# Patient Record
Sex: Male | Born: 1985 | Race: Black or African American | Hispanic: No | Marital: Single | Smoking: Former smoker
Health system: Southern US, Community
[De-identification: ages and names within clinical notes are randomized; demographics above are authoritative.]

---
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
## 2008-04-20 ENCOUNTER — Emergency Department (HOSPITAL_COMMUNITY): Admission: EM | Admit: 2008-04-20 | Discharge: 2008-04-20 | Payer: Self-pay | Admitting: Emergency Medicine

## 2011-11-09 ENCOUNTER — Encounter (HOSPITAL_COMMUNITY): Payer: Self-pay | Admitting: *Deleted

## 2011-11-09 ENCOUNTER — Emergency Department (HOSPITAL_COMMUNITY)
Admission: EM | Admit: 2011-11-09 | Discharge: 2011-11-09 | Disposition: A | Discharge status: Home / Self Care | Payer: Self-pay | Attending: Emergency Medicine | Admitting: Emergency Medicine

## 2011-11-09 DIAGNOSIS — S39012A Strain of muscle, fascia and tendon of lower back, initial encounter: Secondary | ICD-10-CM

## 2011-11-09 DIAGNOSIS — X500XXA Overexertion from strenuous movement or load, initial encounter: Secondary | ICD-10-CM | POA: Insufficient documentation

## 2011-11-09 DIAGNOSIS — S335XXA Sprain of ligaments of lumbar spine, initial encounter: Secondary | ICD-10-CM | POA: Insufficient documentation

## 2011-11-09 DIAGNOSIS — F17200 Nicotine dependence, unspecified, uncomplicated: Secondary | ICD-10-CM | POA: Insufficient documentation

## 2011-11-09 MED ORDER — IBUPROFEN 600 MG PO TABS: 600.0000 mg | ORAL_TABLET | Freq: Four times a day (QID) | ORAL | Status: AC | PRN

## 2011-11-09 MED ORDER — DIAZEPAM 5 MG PO TABS
5.0000 mg | ORAL_TABLET | Freq: Once | ORAL | Status: AC
  Administered 2011-11-09: 5 mg via ORAL
  Filled 2011-11-09: qty 1

## 2011-11-09 MED ORDER — OXYCODONE-ACETAMINOPHEN 5-325 MG PO TABS: 1.0000 | ORAL_TABLET | Freq: Four times a day (QID) | ORAL | Status: AC | PRN

## 2011-11-09 MED ORDER — IBUPROFEN 400 MG PO TABS
600.0000 mg | ORAL_TABLET | Freq: Once | ORAL | Status: AC
  Administered 2011-11-09: 600 mg via ORAL
  Filled 2011-11-09: qty 3

## 2011-11-09 MED ORDER — DIAZEPAM 5 MG PO TABS: 5.0000 mg | ORAL_TABLET | Freq: Four times a day (QID) | ORAL | Status: AC | PRN

## 2011-11-09 MED ORDER — OXYCODONE-ACETAMINOPHEN 5-325 MG PO TABS
1.0000 | ORAL_TABLET | Freq: Once | ORAL | Status: AC
  Administered 2011-11-09: 1 via ORAL
  Filled 2011-11-09: qty 1

## 2011-11-09 NOTE — ED Provider Notes (Signed)
History     CSN: [id number]  Arrival date & time 11/09/11  4098   First MD Initiated Contact with Patient 11/09/11 [phone]      Chief Complaint  Patient presents with  . Back Pain    (Consider location/radiation/quality/duration/timing/severity/associated sxs/prior treatment) Patient is a 26 y.o. male presenting with back pain. The history is provided by the patient.  Back Pain  This is a recurrent problem. Pertinent negatives include no chest pain, no numbness, no abdominal pain and no weakness.  patient has lower back pain after lifting boxes and tattooing while bent over. He states that he felt a pop and then had pain. No numbness or weakness. Pain is worse with movement or sitting down. No loss of bladder or bowel control. 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 No abdominal pain. No dysuria.  History reviewed. No pertinent past medical history.  History reviewed. No pertinent past surgical history.  No family history on file.  History  Substance Use Topics  . Smoking status: Current Everyday Smoker  . Smokeless tobacco: Not on file  . Alcohol Use:      occ      Review of Systems  Constitutional: Negative for chills.  Cardiovascular: Negative for chest pain.  Gastrointestinal: Negative for abdominal pain.  Genitourinary: Negative for flank pain and penile pain.  Musculoskeletal: Positive for back pain.  Neurological: Negative for weakness and numbness.  Hematological: Negative for adenopathy. Does not bruise/bleed easily.    Allergies  Review of patient's allergies indicates no known allergies.  Home Medications   Current Outpatient Rx  Name Route Sig Dispense Refill  . CYCLOBENZAPRINE HCL 10 MG PO TABS Oral Take 10 mg by mouth once.    Marland Kitchen DIAZEPAM 5 MG PO TABS Oral Take 1 tablet (5 mg total) by mouth every 6 (six) hours as needed (spasm). 10 tablet 0  . IBUPROFEN 600 MG PO TABS Oral Take 1 tablet (600 mg total) by mouth every 6 (six)  hours as needed for pain. 20 tablet 0  . OXYCODONE-ACETAMINOPHEN 5-325 MG PO TABS Oral Take 1-2 tablets by mouth every 6 (six) hours as needed for pain. 10 tablet 0    BP 110/52  Pulse 78  Temp 97.9 F (36.6 C) (Oral)  Resp 18  SpO2 100%  Physical Exam  Constitutional: He appears well-developed and well-nourished.  HENT:  Head: Normocephalic.  Eyes: Pupils are equal, round, and reactive to light.  Neck: Neck supple.  Cardiovascular: Normal rate and regular rhythm.   Abdominal: There is no tenderness.  Musculoskeletal:       Decreased range of motion in lower back. Pain with movement. No midline step-off or deformity.  Neurological: He is alert.       Lower extremity and peroneal sensation strength intact.  Skin: Skin is warm.    ED Course  Procedures (including critical care time)  Labs Reviewed - No data to display No results found.   1. Lumbar strain       MDM  Patient with lumbar strain. No red flags. Patient's had a history of back pain previously. He will be discharged home with pain meds and muscle relaxers        Juliet Rude. Rubin Payor, MD 11/09/11 [phone]

## 2011-11-09 NOTE — ED Notes (Signed)
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  Pt also works at [employer].  Pt states hurts to sit down.    Pain only with movement

## 2012-05-04 ENCOUNTER — Emergency Department (HOSPITAL_COMMUNITY)
Admission: EM | Admit: 2012-05-04 | Discharge: 2012-05-04 | Disposition: A | Discharge status: Home / Self Care | Payer: BC Managed Care – PPO | Source: Home / Self Care | Attending: Family Medicine | Admitting: Family Medicine

## 2012-05-04 ENCOUNTER — Encounter (HOSPITAL_COMMUNITY): Payer: Self-pay | Admitting: Emergency Medicine

## 2012-05-04 DIAGNOSIS — M25519 Pain in unspecified shoulder: Secondary | ICD-10-CM

## 2012-05-04 MED ORDER — TRAMADOL HCL 50 MG PO TABS: 50.0000 mg | ORAL_TABLET | Freq: Three times a day (TID) | ORAL | Status: DC | PRN

## 2012-05-04 MED ORDER — CYCLOBENZAPRINE HCL 10 MG PO TABS: 10.0000 mg | ORAL_TABLET | Freq: Two times a day (BID) | ORAL | Status: DC | PRN

## 2012-05-04 MED ORDER — IBUPROFEN 600 MG PO TABS: 600.0000 mg | ORAL_TABLET | Freq: Three times a day (TID) | ORAL | Status: DC | PRN

## 2012-05-04 NOTE — ED Provider Notes (Signed)
History     CSN: [id number]  Arrival date & time 05/04/12  1518   First MD Initiated Contact with Patient 05/04/12 1538      Chief Complaint  Patient presents with  . Shoulder Pain    left shoulder pain x 1 wk.     (Consider location/radiation/quality/duration/timing/severity/associated sxs/prior treatment) HPI Comments: 27 year old smoker male with no significant past medical history. Here complaining of left shoulder pain and limited range of motion for about 3 days. Patient works at [employer] and [employer] all day. He also states that has young children he picks up from floor recently. Patient does use ice and stretching with mild relief. Denies pain radiation. Denies weakness, numbness or paresthesia in the left arm or hand. No back or neck pain.   History reviewed. No pertinent past medical history.  History reviewed. No pertinent past surgical history.  History reviewed. No pertinent family history.  History  Substance Use Topics  . Smoking status: Current Every Day Smoker  . Smokeless tobacco: Not on file  . Alcohol Use: Not on file     Comment: occ      Review of Systems  Constitutional: Negative for fever and chills.  Musculoskeletal:       Left shoulder as per HPI  Skin: Negative for rash and wound.    Allergies  Review of patient's allergies indicates no known allergies.  Home Medications   Current Outpatient Rx  Name  Route  Sig  Dispense  Refill  . cyclobenzaprine (FLEXERIL) 10 MG tablet   Oral   Take 1 tablet (10 mg total) by mouth 2 (two) times daily as needed for muscle spasms.   20 tablet   0   . ibuprofen (ADVIL,MOTRIN) 600 MG tablet   Oral   Take 1 tablet (600 mg total) by mouth every 8 (eight) hours as needed for pain.   21 tablet   0   . traMADol (ULTRAM) 50 MG tablet   Oral   Take 1 tablet (50 mg total) by mouth every 8 (eight) hours as needed for pain.   20 tablet   0     BP 115/69  Pulse 62  Temp(Src) 98.3 F (36.8 C) (Oral)   Resp 16  SpO2 100%  Physical Exam  Nursing note and vitals reviewed. Constitutional: He is oriented to person, place, and time. He appears well-developed and well-nourished. No distress.  HENT:  Head: Normocephalic and atraumatic.  Mouth/Throat: Oropharynx is clear and moist. No oropharyngeal exudate.  Cardiovascular: Normal rate, regular rhythm and normal heart sounds.   Pulmonary/Chest: Effort normal and breath sounds normal. No respiratory distress. He has no wheezes. He has no rales. He exhibits no tenderness.  Musculoskeletal:  Left shoulder: No obvious deformity, no erythema, swelling, bruising or increased temperature. Diffused tenderness anterior and superior to glenohumeral joint. Limited range of motion due to pain but no signs of dislocation. Patient able to passively and actively abduct left arm at shoulder joint past 90 degrees with reported moderate to severe pain. Positive empty can test. No significant discomfort with arm adduction past mid line or with posterior extension with extended arm.  Entire left upper extremity appears neurovascularly intact.  Lymphadenopathy:    He has no cervical adenopathy.  Neurological: He is alert and oriented to person, place, and time.  Skin: No rash noted. He is not diaphoretic.    ED Course  Procedures (including critical care time)  Labs Reviewed - No data to display No results  found.   1. Shoulder pain, acute, left       MDM  Impress bursitis/capsulitis versus rotator cuff tendinitis. Prescribed Flexeril, ibuprofen and tramadol. Supportive care including rehabilitation exercises and red flags that should prompt his return to medical attention discussed with patient and provided in writing.        Sharin Grave, MD 05/04/12 [phone]

## 2012-05-04 NOTE — ED Notes (Signed)
Pt states that he has limited ROM with left shoulder and is unable to lift arm above head.  Pt states there is a tightness/swelling feeing in the joint.  Pt works for [employer] and does a lot of lifting of boxes.  Pt has used ice and stretching with mild relief.

## 2012-06-14 ENCOUNTER — Emergency Department (INDEPENDENT_AMBULATORY_CARE_PROVIDER_SITE_OTHER): Payer: BC Managed Care – PPO

## 2012-06-14 ENCOUNTER — Emergency Department (HOSPITAL_COMMUNITY)
Admission: EM | Admit: 2012-06-14 | Discharge: 2012-06-14 | Disposition: A | Discharge status: Home / Self Care | Payer: BC Managed Care – PPO | Source: Home / Self Care | Attending: Emergency Medicine | Admitting: Emergency Medicine

## 2012-06-14 ENCOUNTER — Encounter (HOSPITAL_COMMUNITY): Payer: Self-pay | Admitting: Emergency Medicine

## 2012-06-14 DIAGNOSIS — M67912 Unspecified disorder of synovium and tendon, left shoulder: Secondary | ICD-10-CM

## 2012-06-14 DIAGNOSIS — M67919 Unspecified disorder of synovium and tendon, unspecified shoulder: Secondary | ICD-10-CM

## 2012-06-14 MED ORDER — DICLOFENAC SODIUM 75 MG PO TBEC: 75.0000 mg | DELAYED_RELEASE_TABLET | Freq: Two times a day (BID) | ORAL | Status: DC

## 2012-06-14 NOTE — ED Notes (Signed)
Pt c/o shoulder injury x 1.5 month. This is a recurrent pain that has not gotten better. Yesterday while working pt reports he felt a pop and now ROM is limited. Has tried stretching and warm treatment previously with relief. Patient is alert and oriented.

## 2012-06-14 NOTE — ED Provider Notes (Signed)
History     CSN: [id number]  Arrival date & time 06/14/12  1439   First MD Initiated Contact with Patient 06/14/12 1516      Chief Complaint  Patient presents with  . Shoulder Injury    (Consider location/radiation/quality/duration/timing/severity/associated sxs/prior treatment) HPI Comments:  Patient presents urgent care describing ongoing shoulder pain for the last 4-5 weeks. Patient describes that while he was at work yesterday and felt a pop top of her shoulder. Have been having pain since then has tried to stretch the shoulder and have been applying warm compresses with some minimal relief. Patient denies any weakness or paresthesias to his left upper extremity. Patient describes a sensation of grinding on top of his left shoulder points to acromioclavicular region.  Patient is a 27 y.o. male presenting with shoulder injury. The history is provided by the patient.  Shoulder Injury This is a recurrent problem. The problem occurs constantly. The problem has not changed since onset.Pertinent negatives include no abdominal pain and no shortness of breath. The symptoms are aggravated by bending (raising arm foward.Marland Kitchen). The symptoms are relieved by rest. The treatment provided no relief.    History reviewed. No pertinent past medical history.  History reviewed. No pertinent past surgical history.  History reviewed. No pertinent family history.  History  Substance Use Topics  . Smoking status: Current Every Day Smoker  . Smokeless tobacco: Not on file  . Alcohol Use: Not on file     Comment: occ      Review of Systems  Constitutional: Negative for fever, chills, diaphoresis, activity change, appetite change and fatigue.  Respiratory: Negative for cough and shortness of breath.   Gastrointestinal: Negative for abdominal pain.  Musculoskeletal: Positive for joint swelling. Negative for myalgias.  Skin: Negative for color change, pallor, rash and wound.  Neurological: Negative  for facial asymmetry, weakness and numbness.    Allergies  Review of patient's allergies indicates no known allergies.  Home Medications   Current Outpatient Rx  Name  Route  Sig  Dispense  Refill  . diclofenac (VOLTAREN) 75 MG EC tablet   Oral   Take 1 tablet (75 mg total) by mouth 2 (two) times daily.   14 tablet   0   . traMADol (ULTRAM) 50 MG tablet   Oral   Take 1 tablet (50 mg total) by mouth every 8 (eight) hours as needed for pain.   20 tablet   0     BP 114/73  Pulse 72  Temp(Src) 98.4 F (36.9 C) (Oral)  Resp 16  SpO2 99%  Physical Exam  Constitutional: Vital signs are normal. He appears well-developed and well-nourished.  Non-toxic appearance. He does not have a sickly appearance. He does not appear ill. No distress.  Musculoskeletal: He exhibits tenderness.       Left shoulder: He exhibits decreased range of motion, tenderness, bony tenderness, swelling and deformity. He exhibits no effusion, no crepitus, no laceration, no pain, no spasm, normal pulse and normal strength.       Arms: Neurological: He is alert.  Skin: No rash noted. No erythema.    ED Course  Procedures (including critical care time)  Labs Reviewed - No data to display Dg Shoulder Left  06/14/2012  *RADIOLOGY REPORT*  Clinical Data: Left shoulder pain for 2 months, no injury  LEFT SHOULDER - 2+ VIEW  Comparison: None.  Findings: The left humeral head is in normal position within the left glenohumeral joint space.  The left [facility]  joint is normally aligned.  No acute abnormality is seen.  IMPRESSION: Negative.   Original Report Authenticated By: Dwyane Dee, M.D.      1. Rotator cuff disorder, left       MDM  Left chronic and recurrent shoulder pain. Exam was somewhat consistent with a rotator cuff syndrome/injury. Patient had been placed on a shoulder sling and instructed to followup with the orthopedic Dr. been provided with a diclofenac prescription and also instructed not to take any  other NSAIDs. Patient agrees with treatment plan and followup care with orthopedic provider. Work no restrictions was provided for the next 3-5 days until seen by the orthopedic Dr.        Jimmie Molly, MD 06/14/12 1700

## 2012-06-29 MED ORDER — DICLOFENAC SODIUM 75 MG PO TBEC: 75.0000 mg | DELAYED_RELEASE_TABLET | Freq: Two times a day (BID) | ORAL | Status: DC

## 2012-11-18 ENCOUNTER — Emergency Department (INDEPENDENT_AMBULATORY_CARE_PROVIDER_SITE_OTHER): Payer: BC Managed Care – PPO

## 2012-11-18 ENCOUNTER — Encounter (HOSPITAL_COMMUNITY): Payer: Self-pay | Admitting: Emergency Medicine

## 2012-11-18 ENCOUNTER — Emergency Department (INDEPENDENT_AMBULATORY_CARE_PROVIDER_SITE_OTHER)
Admission: EM | Admit: 2012-11-18 | Discharge: 2012-11-18 | Disposition: A | Discharge status: Home / Self Care | Payer: BC Managed Care – PPO | Source: Home / Self Care | Attending: Family Medicine | Admitting: Family Medicine

## 2012-11-18 DIAGNOSIS — S63259A Unspecified dislocation of unspecified finger, initial encounter: Secondary | ICD-10-CM

## 2012-11-18 NOTE — ED Provider Notes (Signed)
Scott Black is a 26 y.o. male who presents to Urgent Care today for left hand pain. Patient dislocated his left PIP playing basketball yesterday. He noted the left finger ulnarly deviated at the PIP joint. He reduced it himself. He notes pain and swelling at the PIP joint currently. He denies any significant difficulty with motion. He denies any radiating pain weakness or numbness. Feels well otherwise.   History reviewed. No pertinent past medical history. History  Substance Use Topics  . Smoking status: Current Every Day Smoker  . Smokeless tobacco: Not on file  . Alcohol Use: Yes     Comment: occ   ROS as above Medications reviewed. No current facility-administered medications for this encounter.   Current Outpatient Prescriptions  Medication Sig Dispense Refill  . diclofenac (VOLTAREN) 75 MG EC tablet Take 1 tablet (75 mg total) by mouth 2 (two) times daily.  14 tablet  0  . diclofenac (VOLTAREN) 75 MG EC tablet Take 1 tablet (75 mg total) by mouth 2 (two) times daily.  14 tablet  0  . traMADol (ULTRAM) 50 MG tablet Take 1 tablet (50 mg total) by mouth every 8 (eight) hours as needed for pain.  20 tablet  0    Exam:  BP 127/73  Pulse 85  Temp(Src) 98.5 F (36.9 C) (Oral)  Resp 20  SpO2 100% Gen: Well NAD Left fifth digit: Swelling ecchymosis at the PIP. Flexion and extension are intact. Sensation is intact. No swan-neck or boutonniere deformity present.     No results found for this or any previous visit (from the past 24 hour(s)). Dg Finger Little Left  11/18/2012   CLINICAL DATA:  PIP dislocation. Self reduction.  EXAM: LEFT LITTLE FINGER 2+V  COMPARISON:  None.  FINDINGS: No subluxation or dislocation currently. On the lateral view, there is a tiny bone density overlying the distal aspect of the proximal phalanx which may very small avulsed fragment. No additional acute bony abnormality. Soft tissues are intact. Soft tissues are intact.  IMPRESSION: Suspect small avulsed  fragment anteriorly. No subluxation or dislocation.   Electronically Signed   By: Charlett Nose M.D.   On: 11/18/2012 12:46    Assessment and Plan: 27 y.o. male with possible tiny avulsion fragment following a quarter deviation dislocation of the PIP of the left fifth digit.  Plan for buddy taping 2 fingers together for at least one week.And one month at basketball Aleve for pain as needed.  Come back as needed.      Rodolph Bong, MD 11/18/12 1308

## 2012-11-18 NOTE — ED Notes (Signed)
Left hand injury while playing basketball yesterday.  Reports initially left little finger was pointing to the side and patient "straightened " it.  Finger bruised, swollen and painful, unable to move as normally moves this finger.  Brisk cap refill to this finger.

## 2013-05-30 IMAGING — CR DG SHOULDER 2+V*L*
3 series · 3 of 3 positions shown · non-contrast
Comparison: None.

CLINICAL DATA: Left shoulder pain for 2 months, no injury

LEFT SHOULDER - 2+ VIEW

[view not recorded (1 of 3)]
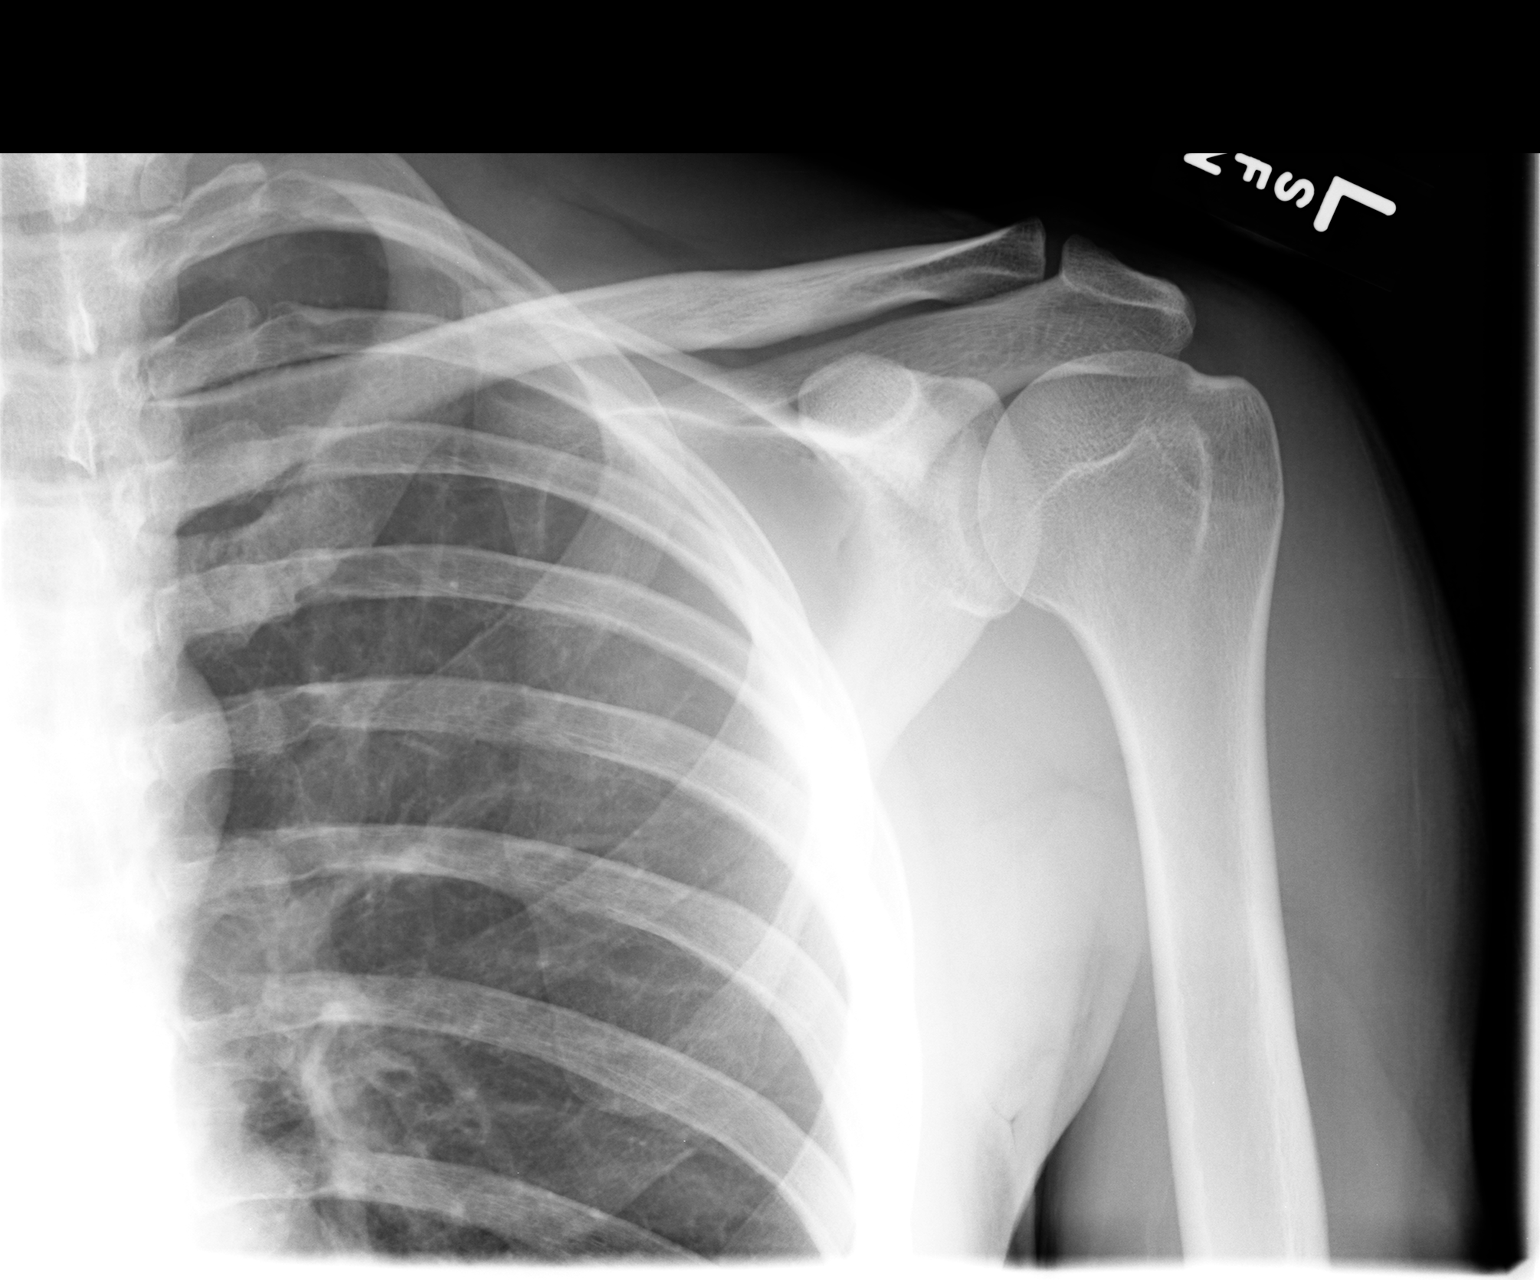

[view not recorded (2 of 3)]
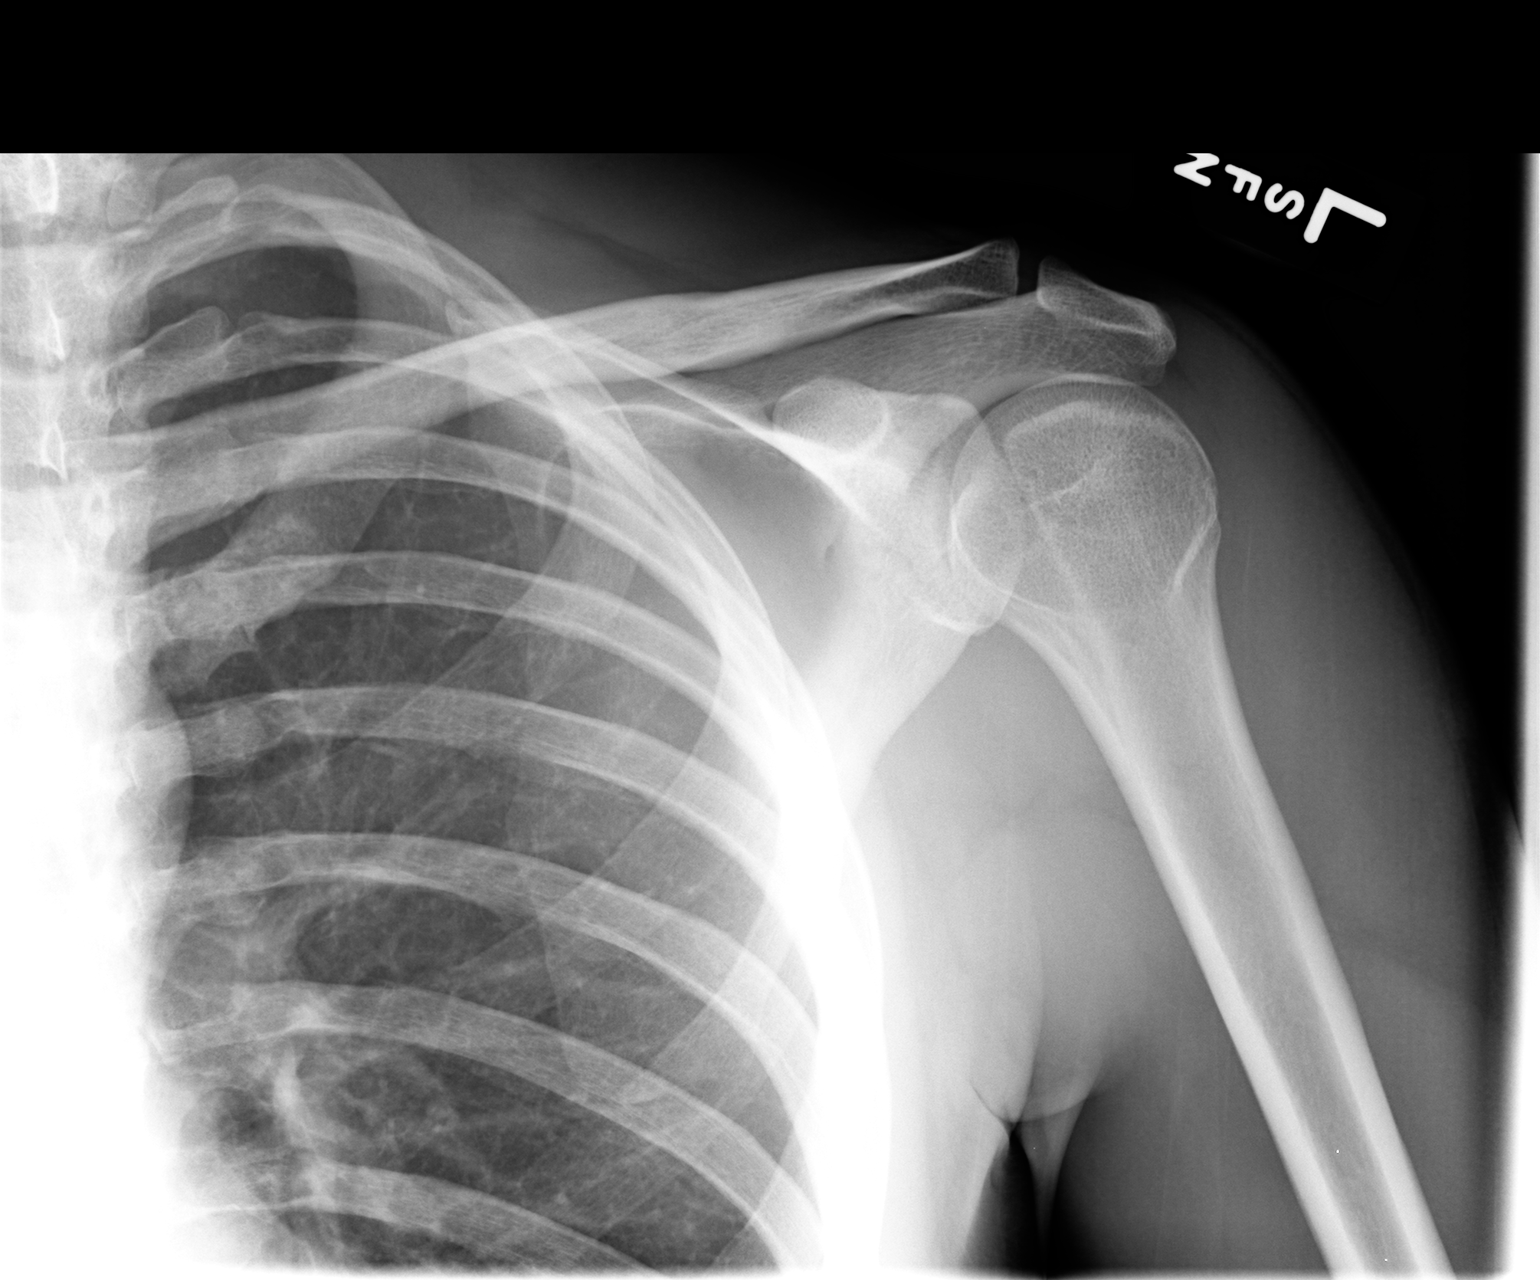

[view not recorded (3 of 3)]
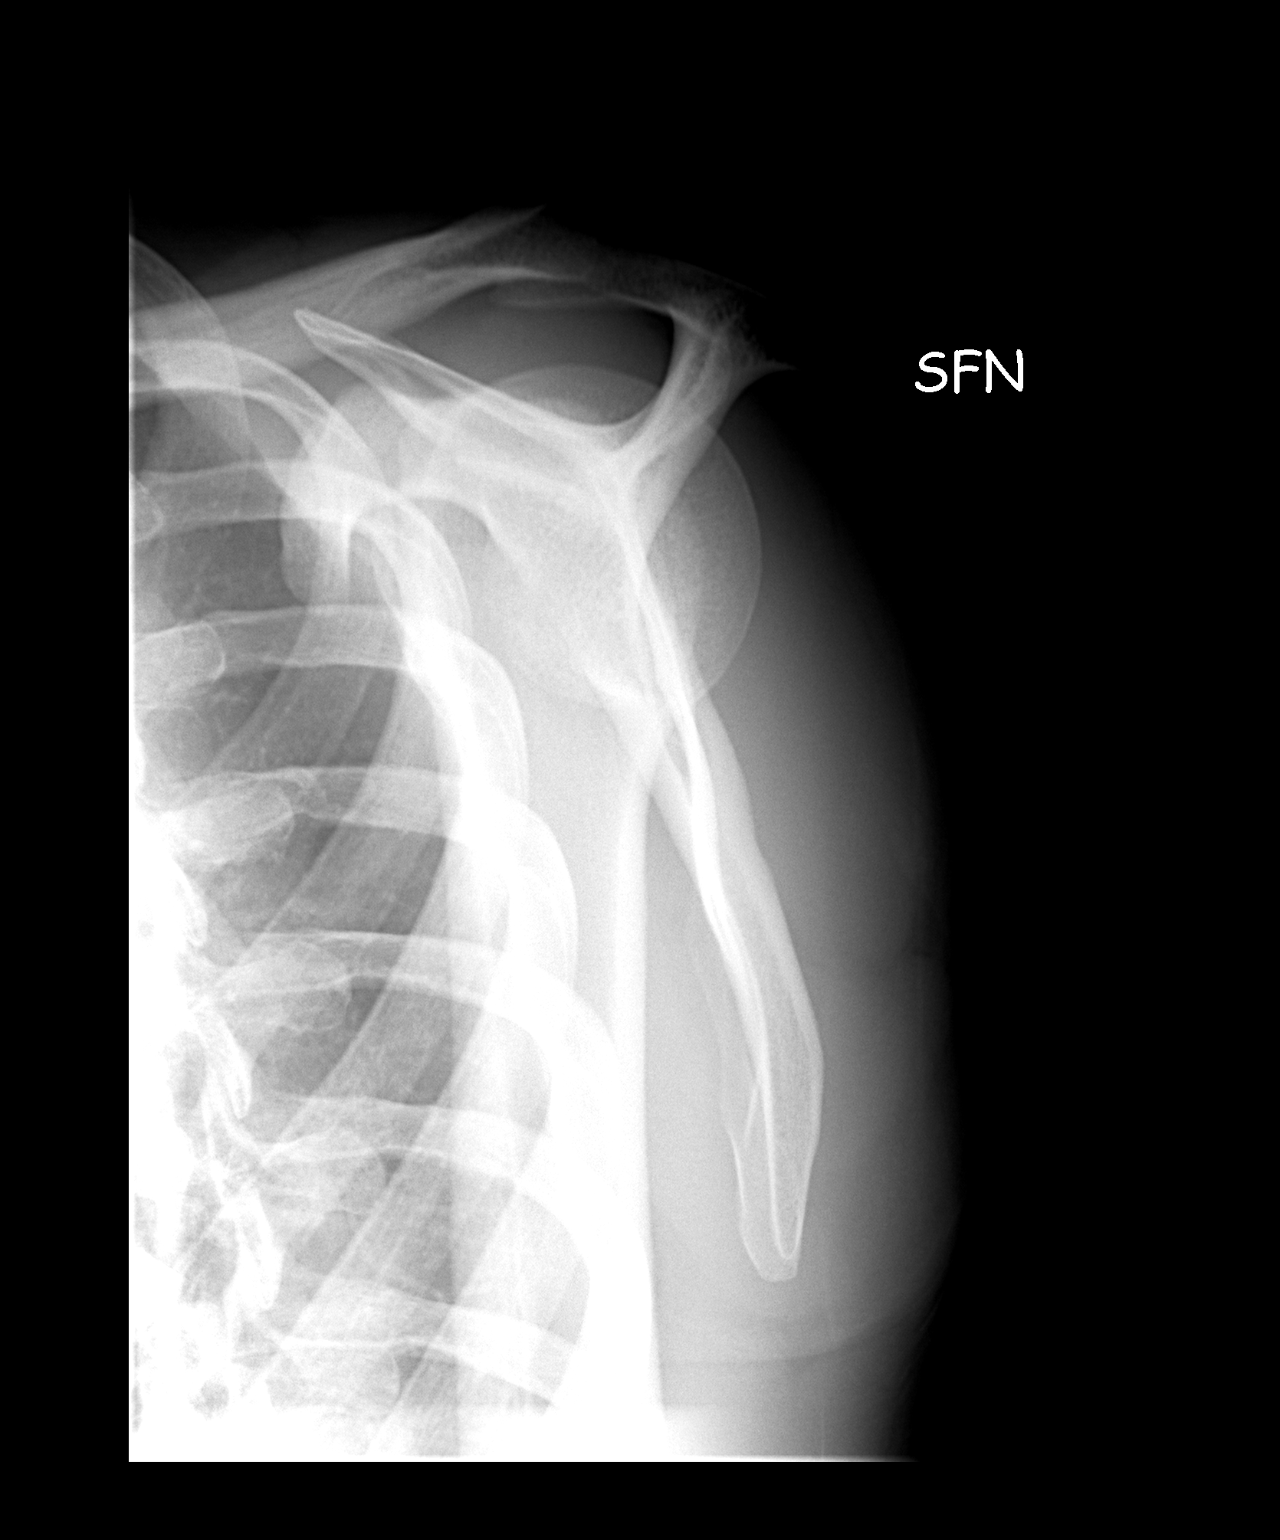

[3 of 3 positions shown; findings below may reference images not displayed]

FINDINGS: The left humeral head is in normal position within the
left glenohumeral joint space.  The left AC joint is normally
aligned.  No acute abnormality is seen.
IMPRESSION: Negative.

## 2013-11-23 ENCOUNTER — Encounter (HOSPITAL_COMMUNITY): Payer: Self-pay | Admitting: Emergency Medicine

## 2013-11-23 ENCOUNTER — Emergency Department (INDEPENDENT_AMBULATORY_CARE_PROVIDER_SITE_OTHER)
Admission: EM | Admit: 2013-11-23 | Discharge: 2013-11-23 | Disposition: A | Discharge status: Home / Self Care | Payer: Self-pay | Source: Home / Self Care | Attending: Emergency Medicine | Admitting: Emergency Medicine

## 2013-11-23 DIAGNOSIS — J069 Acute upper respiratory infection, unspecified: Secondary | ICD-10-CM

## 2013-11-23 LAB — POCT RAPID STREP A: Streptococcus, Group A Screen (Direct): NEGATIVE

## 2013-11-23 MED ORDER — IPRATROPIUM BROMIDE 0.06 % NA SOLN: 2.0000 | Freq: Four times a day (QID) | NASAL | Status: DC

## 2013-11-23 MED ORDER — NAPROXEN 500 MG PO TABS: 500.0000 mg | ORAL_TABLET | Freq: Two times a day (BID) | ORAL | Status: DC

## 2013-11-23 MED ORDER — GUAIFENESIN-CODEINE 100-10 MG/5ML PO SYRP: 5.0000 mL | ORAL_SOLUTION | Freq: Three times a day (TID) | ORAL | Status: DC | PRN

## 2013-11-23 NOTE — ED Notes (Signed)
C/o sore throat ; "feels just like strep previously"

## 2013-11-23 NOTE — ED Provider Notes (Signed)
  Chief Complaint   Sore Throat   History of Present Illness   Scott Black is a 28 year old male who's had a two-day history of sore throat, temperature of up to 102, sweats, nasal congestion, rhinorrhea with yellow drainage, headache, and cough productive yellow sputum. He denies any earache, eyes symptoms, stiff neck, difficulty breathing, or GI symptoms. He has strep about 4 months ago which was treated completely with a course of antibiotics. He's been exposed to several people at work who've had upper respiratory infections.  Review of Systems   Other than as noted above, the patient denies any of the following symptoms: Systemic:  No fevers, chills, sweats, or myalgias. Eye:  No redness or discharge. ENT:  No ear pain, headache, nasal congestion, drainage, sinus pressure, or sore throat. Neck:  No neck pain, stiffness, or swollen glands. Lungs:  No cough, sputum production, hemoptysis, wheezing, chest tightness, shortness of breath or chest pain. GI:  No abdominal pain, nausea, vomiting or diarrhea.  PMFSH   Past medical history, family history, social history, meds, and allergies were reviewed.   Physical exam   Vital signs:  BP 119/79  Pulse 64  Temp(Src) 97.7 F (36.5 C) (Oral)  Resp 16  SpO2 100% General:  Alert and oriented.  In no distress.  Skin warm and dry. Eye:  No conjunctival injection or drainage. Lids were normal. ENT:  TMs and canals were normal, without erythema or inflammation.  Nasal mucosa was clear and uncongested, without drainage.  Mucous membranes were moist.  Pharynx was clear with no exudate or drainage.  There were no oral ulcerations or lesions. Neck:  Supple, no adenopathy, tenderness or mass. Lungs:  No respiratory distress.  Lungs were clear to auscultation, without wheezes, rales or rhonchi.  Breath sounds were clear and equal bilaterally.  Heart:  Regular rhythm, without gallops, murmers or rubs. Skin:  Clear, warm, and dry, without rash or  lesions.  Labs   Results for orders placed during the hospital encounter of 11/23/13  POCT RAPID STREP A ([facility] ONLY)      Result Value Ref Range   Streptococcus, Group A Screen (Direct) NEGATIVE  NEGATIVE    Assessment     The encounter diagnosis was Viral URI.  Plan    1.  Meds:  The following meds were prescribed:   New Prescriptions   GUAIFENESIN-CODEINE (ROBITUSSIN AC) 100-10 MG/5ML SYRUP    Take 5 mLs by mouth 3 (three) times daily as needed for cough.   IPRATROPIUM (ATROVENT) 0.06 % NASAL SPRAY    Place 2 sprays into both nostrils 4 (four) times daily.   NAPROXEN (NAPROSYN) 500 MG TABLET    Take 1 tablet (500 mg total) by mouth 2 (two) times daily.    2.  Patient Education/Counseling:  The patient was given appropriate handouts, self care instructions, and instructed in symptomatic relief.  Instructed to get extra fluids and extra rest.    3.  Follow up:  The patient was told to follow up here if no better in 3 to 4 days, or sooner if becoming worse in any way, and given some red flag symptoms such as increasing fever, difficulty breathing, chest pain, or persistent vomiting which would prompt immediate return.       Reuben Likes, MD 11/23/13 1004

## 2013-11-23 NOTE — Discharge Instructions (Signed)

## 2013-11-25 LAB — CULTURE, GROUP A STREP

## 2013-12-27 ENCOUNTER — Encounter (HOSPITAL_COMMUNITY): Payer: Self-pay | Admitting: Emergency Medicine

## 2013-12-27 ENCOUNTER — Emergency Department (INDEPENDENT_AMBULATORY_CARE_PROVIDER_SITE_OTHER)
Admission: EM | Admit: 2013-12-27 | Discharge: 2013-12-27 | Disposition: A | Discharge status: Home / Self Care | Payer: Self-pay | Source: Home / Self Care | Attending: Emergency Medicine | Admitting: Emergency Medicine

## 2013-12-27 DIAGNOSIS — J019 Acute sinusitis, unspecified: Secondary | ICD-10-CM

## 2013-12-27 DIAGNOSIS — J029 Acute pharyngitis, unspecified: Secondary | ICD-10-CM

## 2013-12-27 DIAGNOSIS — J989 Respiratory disorder, unspecified: Secondary | ICD-10-CM

## 2013-12-27 MED ORDER — AMOXICILLIN 500 MG PO CAPS: 500.0000 mg | ORAL_CAPSULE | Freq: Three times a day (TID) | ORAL | Status: DC

## 2013-12-27 MED ORDER — BENZONATATE 100 MG PO CAPS: 100.0000 mg | ORAL_CAPSULE | Freq: Two times a day (BID) | ORAL | Status: DC | PRN

## 2013-12-27 MED ORDER — HYDROCODONE-HOMATROPINE 5-1.5 MG/5ML PO SYRP: 5.0000 mL | ORAL_SOLUTION | Freq: Four times a day (QID) | ORAL | Status: DC | PRN

## 2013-12-27 MED ORDER — FLUTICASONE PROPIONATE 50 MCG/ACT NA SUSP: 2.0000 | Freq: Every day | NASAL | Status: DC

## 2013-12-27 NOTE — ED Provider Notes (Signed)
CSN: [id number]     Arrival date & time 12/27/13  1835 History   First MD Initiated Contact with Patient 12/27/13 1847     Chief Complaint  Patient presents with  . Oral Swelling   (Consider location/radiation/quality/duration/timing/severity/associated sxs/prior Treatment) HPI Comments: Scott Black is a very pleasant 28 yo black male who presents with a continued cough, post nasal drip, fatigue and overall malaise. He was seen here a month ago with a sore throat. At that time he was diagnosed with a viral illness and treated symptomatically. The sore throat improved, but then he began to have a productive cough, yellow phlegm with post nasal drip, and continued cough that has restarted the pain in his throat. He attributes this from the cough. No fevers. Generalized malaise. Missed work [employer].    History reviewed. No pertinent past medical history. History reviewed. No pertinent past surgical history. History reviewed. No pertinent family history. History  Substance Use Topics  . Smoking status: Current Every Day Smoker  . Smokeless tobacco: Not on file  . Alcohol Use: Yes     Comment: occ    Review of Systems  All other systems reviewed and are negative.   Allergies  Review of patient's allergies indicates no known allergies.  Home Medications   Prior to Admission medications   Medication Sig Start Date End Date Taking? Authorizing Provider  amoxicillin (AMOXIL) 500 MG capsule Take 1 capsule (500 mg total) by mouth 3 (three) times daily. 12/27/13   Riki SheerMichelle G Cartha Rotert, PA-C  benzonatate (TESSALON) 100 MG capsule Take 1 capsule (100 mg total) by mouth 2 (two) times daily as needed for cough (to use in the daytime). 12/27/13   Riki SheerMichelle G Yolette Hastings, PA-C  diclofenac (VOLTAREN) 75 MG EC tablet Take 1 tablet (75 mg total) by mouth 2 (two) times daily. 06/29/12   Reuben Likesavid C Keller, MD  diclofenac (VOLTAREN) 75 MG EC tablet Take 1 tablet (75 mg total) by mouth 2 (two) times daily. 06/29/12    Reuben Likesavid C Keller, MD  fluticasone (FLONASE) 50 MCG/ACT nasal spray Place 2 sprays into both nostrils daily. 12/27/13   Riki SheerMichelle G Lashundra Shiveley, PA-C  guaiFENesin-codeine (ROBITUSSIN AC) 100-10 MG/5ML syrup Take 5 mLs by mouth 3 (three) times daily as needed for cough. 11/23/13   Reuben Likesavid C Keller, MD  HYDROcodone-homatropine [facility](HYCODAN) 5-1.5 MG/5ML syrup Take 5 mLs by mouth every 6 (six) hours as needed for cough. 12/27/13   Riki SheerMichelle G Tremell Reimers, PA-C  ipratropium (ATROVENT) 0.06 % nasal spray Place 2 sprays into both nostrils 4 (four) times daily. 11/23/13   Reuben Likesavid C Keller, MD  naproxen (NAPROSYN) 500 MG tablet Take 1 tablet (500 mg total) by mouth 2 (two) times daily. 11/23/13   Reuben Likesavid C Keller, MD  traMADol (ULTRAM) 50 MG tablet Take 1 tablet (50 mg total) by mouth every 8 (eight) hours as needed for pain. 05/04/12   Adlih Moreno-Coll, MD   BP 110/72  Pulse 87  Temp(Src) 98.7 F (37.1 C) (Oral)  Resp 16  SpO2 100% Physical Exam  Nursing note and vitals reviewed. Constitutional: He is oriented to person, place, and time. He appears well-developed and well-nourished. No distress.  HENT:  Head: Normocephalic and atraumatic.  Left Ear: External ear normal.  Right ear TM, with post retro fluid, non retracted, erythematous oropharynx with mild vascularity and injection   Cardiovascular: Normal rate and regular rhythm.   Pulmonary/Chest: Effort normal. No respiratory distress. He has no wheezes.  Mild rhonchi in the lower bases  Neurological: He is alert and oriented to person, place, and time.  Skin: Skin is warm and dry. He is not diaphoretic.  Psychiatric: His behavior is normal.    ED Course  Procedures (including critical care time) Labs Review Labs Reviewed - No data to display  Imaging Review No results found.   MDM   1. Respiratory illness   2. Pharyngitis   3. Acute sinusitis, recurrence not specified, unspecified location    1. Given duration will cover with antibiotic. Blood tinged  sputum appears to be coming from oropharynx by exam. Cough medications and nasal spray for post nasal drip. Also suggest an antihistamine. Work note given.     Riki SheerMichelle G Leovanni Bjorkman, PA-C 12/27/13 1919

## 2013-12-27 NOTE — Discharge Instructions (Signed)
Sinusitis Sinusitis is redness, soreness, and puffiness (inflammation) of the air pockets in the bones of your face (sinuses). The redness, soreness, and puffiness can cause air and mucus to get trapped in your sinuses. This can allow germs to grow and cause an infection.  HOME CARE   Drink enough fluids to keep your pee (urine) clear or pale yellow.  Use a humidifier in your home.  Run a hot shower to create steam in the bathroom. Sit in the bathroom with the door closed. Breathe in the steam 3-4 times a day.  Put a warm, moist washcloth on your face 3-4 times a day, or as told by your doctor.  Use salt water sprays (saline sprays) to wet the thick fluid in your nose. This can help the sinuses drain.  Only take medicine as told by your doctor. GET HELP RIGHT AWAY IF:   Your pain gets worse.  You have very bad headaches.  You are sick to your stomach (nauseous).  You throw up (vomit).  You are very sleepy (drowsy) all the time.  Your face is puffy (swollen).  Your vision changes.  You have a stiff neck.  You have trouble breathing. MAKE SURE YOU:   Understand these instructions.  Will watch your condition.  Will get help right away if you are not doing well or get worse. Document Released: 08/13/2007 Document Revised: 11/19/2011 Document Reviewed: 09/30/2011 [facility] Patient Information 2015 [place], [place]. This information is not intended to replace advice given to you by your health care provider. Make sure you discuss any questions you have with your health care provider.     Use Flonase daily to help with post nasal drip. Starting a Claritin daily will help also. Use syrup for night time cough, use pills for daytime cough. Complete antibiotics. Feel better

## 2013-12-27 NOTE — ED Notes (Signed)
C/o throat swelling.  Mild cough.  States noticed blood tinged sputum.  Denies fever, vomiting, and diarrhea.  No relief with hot tea or cough drops.

## 2013-12-28 NOTE — ED Provider Notes (Signed)
Medical screening examination/treatment/procedure(s) were performed by non-physician practitioner and as supervising physician I was immediately available for consultation/collaboration.  Leslee Homeavid Bentleigh Waren, M.D.   Reuben Likesavid C Loring Liskey, MD 12/28/13 [phone]

## 2014-01-20 ENCOUNTER — Encounter (HOSPITAL_COMMUNITY): Payer: Self-pay | Admitting: Emergency Medicine

## 2014-01-20 ENCOUNTER — Emergency Department (INDEPENDENT_AMBULATORY_CARE_PROVIDER_SITE_OTHER)
Admission: EM | Admit: 2014-01-20 | Discharge: 2014-01-20 | Disposition: A | Discharge status: Home / Self Care | Payer: Self-pay | Source: Home / Self Care | Attending: Emergency Medicine | Admitting: Emergency Medicine

## 2014-01-20 DIAGNOSIS — R0982 Postnasal drip: Secondary | ICD-10-CM

## 2014-01-20 DIAGNOSIS — J029 Acute pharyngitis, unspecified: Secondary | ICD-10-CM

## 2014-01-20 LAB — POCT RAPID STREP A: STREPTOCOCCUS, GROUP A SCREEN (DIRECT): NEGATIVE

## 2014-01-20 NOTE — ED Notes (Signed)
C/o sore throat onset x1 week Sx include: odynophagia, trouble swallowing at times, streaks of blood in his sputum Denies cold sx, dyspnea Alert, no signs of acute distress.

## 2014-01-20 NOTE — ED Provider Notes (Signed)
CSN: [id number]     Arrival date & time 01/20/14  1708 History   First MD Initiated Contact with Patient 01/20/14 1739     Chief Complaint  Patient presents with  . Sore Throat   (Consider location/radiation/quality/duration/timing/severity/associated sxs/prior Treatment) HPI Comments: Persistent cough with PND. Choking at night. Not taking antihistamine as directed from previous visit.   History reviewed. No pertinent past medical history. History reviewed. No pertinent past surgical history. No family history on file. History  Substance Use Topics  . Smoking status: Current Every Day Smoker  . Smokeless tobacco: Not on file  . Alcohol Use: Yes     Comment: occ    Review of Systems  Constitutional: Negative.   HENT: Positive for postnasal drip and sore throat.   Eyes: Negative.   Respiratory: Positive for cough and choking. Negative for shortness of breath, wheezing and stridor.   Cardiovascular: Negative.     Allergies  Review of patient's allergies indicates no known allergies.  Home Medications   Prior to Admission medications   Medication Sig Start Date End Date Taking? Authorizing Provider  amoxicillin (AMOXIL) 500 MG capsule Take 1 capsule (500 mg total) by mouth 3 (three) times daily. 12/27/13   Riki SheerMichelle G Young, PA-C  benzonatate (TESSALON) 100 MG capsule Take 1 capsule (100 mg total) by mouth 2 (two) times daily as needed for cough (to use in the daytime). 12/27/13   Riki SheerMichelle G Young, PA-C  diclofenac (VOLTAREN) 75 MG EC tablet Take 1 tablet (75 mg total) by mouth 2 (two) times daily. 06/29/12   Reuben Likesavid C Keller, MD  diclofenac (VOLTAREN) 75 MG EC tablet Take 1 tablet (75 mg total) by mouth 2 (two) times daily. 06/29/12   Reuben Likesavid C Keller, MD  fluticasone (FLONASE) 50 MCG/ACT nasal spray Place 2 sprays into both nostrils daily. 12/27/13   Riki SheerMichelle G Young, PA-C  guaiFENesin-codeine (ROBITUSSIN AC) 100-10 MG/5ML syrup Take 5 mLs by mouth 3 (three) times daily as needed  for cough. 11/23/13   Reuben Likesavid C Keller, MD  HYDROcodone-homatropine [facility](HYCODAN) 5-1.5 MG/5ML syrup Take 5 mLs by mouth every 6 (six) hours as needed for cough. 12/27/13   Riki SheerMichelle G Young, PA-C  ipratropium (ATROVENT) 0.06 % nasal spray Place 2 sprays into both nostrils 4 (four) times daily. 11/23/13   Reuben Likesavid C Keller, MD  naproxen (NAPROSYN) 500 MG tablet Take 1 tablet (500 mg total) by mouth 2 (two) times daily. 11/23/13   Reuben Likesavid C Keller, MD  traMADol (ULTRAM) 50 MG tablet Take 1 tablet (50 mg total) by mouth every 8 (eight) hours as needed for pain. 05/04/12   Adlih Moreno-Coll, MD   BP 118/73 mmHg  Pulse 74  Temp(Src) 98.1 F (36.7 C) (Oral)  Resp 18  SpO2 98% Physical Exam  Constitutional: He is oriented to person, place, and time. He appears well-developed and well-nourished. No distress.  HENT:  Mouth/Throat: No oropharyngeal exudate.  OP with minor injection and moderate clear PND. No swelling or edema.   Eyes: Conjunctivae and EOM are normal.  Neck: Normal range of motion. Neck supple.  Cardiovascular: Normal rate, regular rhythm and normal heart sounds.   Pulmonary/Chest: Effort normal and breath sounds normal. No respiratory distress. He has no wheezes. He has no rales.  Lymphadenopathy:    He has no cervical adenopathy.  Neurological: He is alert and oriented to person, place, and time. He exhibits normal muscle tone.  Skin: Skin is warm.  Psychiatric: He has a normal mood and affect.  Nursing note and vitals reviewed.   ED Course  Procedures (including critical care time) Labs Review Labs Reviewed  CULTURE, GROUP A STREP  POCT RAPID STREP A ([facility]RG CARE ONLY)    Imaging Review No results found.   MDM   1. PND (post-nasal drip)   2. Sore throat    Use antihistamine allegra or claritin or zyrtec. If need stronger ahntihistamine take 2 mg of Chlor trimeton before bedtime. And robitussin DM Drink lots of fluids, especially before bedtime.      Hayden Rasmussenavid Jahanna Raether,  NP 01/20/14 [phone]

## 2014-01-20 NOTE — Discharge Instructions (Signed)
Upper Respiratory Infection, Adult Use antihistamine allegra or claritin or zyrtec. If need stronger ahntihistamine take 2 mg of Chlor trimeton before bedtime. And robitussin DM Drink lots of fluids, especially before bedtime. An upper respiratory infection (URI) is also sometimes known as the common cold. The upper respiratory tract includes the nose, sinuses, throat, trachea, and bronchi. Bronchi are the airways leading to the lungs. Most people improve within 1 week, but symptoms can last up to 2 weeks. A residual cough may last even longer.  CAUSES Many different viruses can infect the tissues lining the upper respiratory tract. The tissues become irritated and inflamed and often become very moist. Mucus production is also common. A cold is contagious. You can easily spread the virus to others by oral contact. This includes kissing, sharing a glass, coughing, or sneezing. Touching your mouth or nose and then touching a surface, which is then touched by another person, can also spread the virus. SYMPTOMS  Symptoms typically develop 1 to 3 days after you come in contact with a cold virus. Symptoms vary from person to person. They may include:  Runny nose.  Sneezing.  Nasal congestion.  Sinus irritation.  Sore throat.  Loss of voice (laryngitis).  Cough.  Fatigue.  Muscle aches.  Loss of appetite.  Headache.  Low-grade fever. DIAGNOSIS  You might diagnose your own cold based on familiar symptoms, since most people get a cold 2 to 3 times a year. Your caregiver can confirm this based on your exam. Most importantly, your caregiver can check that your symptoms are not due to another disease such as strep throat, sinusitis, pneumonia, asthma, or epiglottitis. Blood tests, throat tests, and X-rays are not necessary to diagnose a common cold, but they may sometimes be helpful in excluding other more serious diseases. Your caregiver will decide if any further tests are required. RISKS  AND COMPLICATIONS  You may be at risk for a more severe case of the common cold if you smoke cigarettes, have chronic heart disease (such as heart failure) or lung disease (such as asthma), or if you have a weakened immune system. The very young and very old are also at risk for more serious infections. Bacterial sinusitis, middle ear infections, and bacterial pneumonia can complicate the common cold. The common cold can worsen asthma and chronic obstructive pulmonary disease (COPD). Sometimes, these complications can require emergency medical care and may be life-threatening. PREVENTION  The best way to protect against getting a cold is to practice good hygiene. Avoid oral or hand contact with people with cold symptoms. Wash your hands often if contact occurs. There is no clear evidence that vitamin C, vitamin E, echinacea, or exercise reduces the chance of developing a cold. However, it is always recommended to get plenty of rest and practice good nutrition. TREATMENT  Treatment is directed at relieving symptoms. There is no cure. Antibiotics are not effective, because the infection is caused by a virus, not by bacteria. Treatment may include:  Increased fluid intake. Sports drinks offer valuable electrolytes, sugars, and fluids.  Breathing heated mist or steam (vaporizer or shower).  Eating chicken soup or other clear broths, and maintaining good nutrition.  Getting plenty of rest.  Using gargles or lozenges for comfort.  Controlling fevers with ibuprofen or acetaminophen as directed by your caregiver.  Increasing usage of your inhaler if you have asthma. Zinc gel and zinc lozenges, taken in the first 24 hours of the common cold, can shorten the duration and lessen the  severity of symptoms. Pain medicines may help with fever, muscle aches, and throat pain. A variety of non-prescription medicines are available to treat congestion and runny nose. Your caregiver can make recommendations and may  suggest nasal or lung inhalers for other symptoms.  HOME CARE INSTRUCTIONS   Only take over-the-counter or prescription medicines for pain, discomfort, or fever as directed by your caregiver.  Use a warm mist humidifier or inhale steam from a shower to increase air moisture. This may keep secretions moist and make it easier to breathe.  Drink enough water and fluids to keep your urine clear or pale yellow.  Rest as needed.  Return to work when your temperature has returned to normal or as your caregiver advises. You may need to stay home longer to avoid infecting others. You can also use a face mask and careful hand washing to prevent spread of the virus. SEEK MEDICAL CARE IF:   After the first few days, you feel you are getting worse rather than better.  You need your caregiver's advice about medicines to control symptoms.  You develop chills, worsening shortness of breath, or brown or red sputum. These may be signs of pneumonia.  You develop yellow or brown nasal discharge or pain in the face, especially when you bend forward. These may be signs of sinusitis.  You develop a fever, swollen neck glands, pain with swallowing, or white areas in the back of your throat. These may be signs of strep throat. SEEK IMMEDIATE MEDICAL CARE IF:   You have a fever.  You develop severe or persistent headache, ear pain, sinus pain, or chest pain.  You develop wheezing, a prolonged cough, cough up blood, or have a change in your usual mucus (if you have chronic lung disease).  You develop sore muscles or a stiff neck. Document Released: 08/20/2000 Document Revised: 05/19/2011 Document Reviewed: 06/01/2013 [facility] Patient Information 2015 [place], [place]. This information is not intended to replace advice given to you by your health care provider. Make sure you discuss any questions you have with your health care provider.

## 2014-01-22 LAB — CULTURE, GROUP A STREP

## 2014-02-09 ENCOUNTER — Emergency Department (INDEPENDENT_AMBULATORY_CARE_PROVIDER_SITE_OTHER)
Admission: EM | Admit: 2014-02-09 | Discharge: 2014-02-09 | Disposition: A | Discharge status: Home / Self Care | Payer: Self-pay | Source: Home / Self Care | Attending: Family Medicine | Admitting: Family Medicine

## 2014-02-09 ENCOUNTER — Encounter (HOSPITAL_COMMUNITY): Payer: Self-pay | Admitting: Family Medicine

## 2014-02-09 DIAGNOSIS — M2141 Flat foot [pes planus] (acquired), right foot: Secondary | ICD-10-CM

## 2014-02-09 DIAGNOSIS — M2142 Flat foot [pes planus] (acquired), left foot: Secondary | ICD-10-CM

## 2014-02-09 DIAGNOSIS — M357 Hypermobility syndrome: Secondary | ICD-10-CM

## 2014-02-09 MED ORDER — DICLOFENAC SODIUM 75 MG PO TBEC: 75.0000 mg | DELAYED_RELEASE_TABLET | Freq: Two times a day (BID) | ORAL | Status: DC

## 2014-02-09 MED ORDER — LIDOCAINE 4 % EX CREA: 1.0000 "application " | TOPICAL_CREAM | CUTANEOUS | Status: DC | PRN

## 2014-02-09 NOTE — ED Provider Notes (Signed)
CSN: [id number]     Arrival date & time 02/09/14  1653 History   First MD Initiated Contact with Patient 02/09/14 1720     Chief Complaint  Patient presents with  . Foot Pain   (Consider location/radiation/quality/duration/timing/severity/associated sxs/prior Treatment) HPI  R foot pain: arch of foot. Started about 4 wks ago. Acutely worsened over the past 7 days. Works on [employer] all day. Burning pain. Constant w/ worsening after getting off feet for the day. Improves w/ massage. Has not taken anything for the pain. Denies change in sensation, fevers, discharge. Denies trauma.   Started to swell yesterday - improved w/ epsum sal soak.   History reviewed. No pertinent past medical history. History reviewed. No pertinent past surgical history. History reviewed. No pertinent family history. History  Substance Use Topics  . Smoking status: Light Tobacco Smoker    Types: Cigarettes  . Smokeless tobacco: Not on file  . Alcohol Use: Yes     Comment: occassional    Review of Systems Per HPI with all other pertinent systems negative.   Allergies  Review of patient's allergies indicates no known allergies.  Home Medications   Prior to Admission medications   Medication Sig Start Date End Date Taking? Authorizing Provider  diclofenac (VOLTAREN) 75 MG EC tablet Take 1 tablet (75 mg total) by mouth 2 (two) times daily. 02/09/14   Ozella Rocksavid J Taylen Wendland, MD  lidocaine (ASPERCREME W/LIDOCAINE) 4 % cream Apply 1 application topically as needed. 02/09/14   Ozella Rocksavid J Kanon Colunga, MD   BP 107/65 mmHg  Pulse 61  Temp(Src) 98.6 F (37 C) (Oral)  Resp 16  SpO2 97% Physical Exam  Constitutional: He is oriented to person, place, and time. He appears well-developed and well-nourished. No distress.  HENT:  Head: Normocephalic and atraumatic.  Eyes: EOM are normal. Pupils are equal, round, and reactive to light.  Neck: Normal range of motion.  Cardiovascular: Normal rate, normal heart sounds and intact distal  pulses.   No murmur heard. Pulmonary/Chest: Effort normal and breath sounds normal.  Abdominal: Soft.  Musculoskeletal: Normal range of motion. He exhibits no edema.  bilat pes planus Beighton score 5   Neurological: He is alert and oriented to person, place, and time.  Skin: Skin is warm and dry. No rash noted. He is not diaphoretic.  Psychiatric: He has a normal mood and affect. His behavior is normal. Judgment and thought content normal.    ED Course  Procedures (including critical care time) Labs Review Labs Reviewed - No data to display  Imaging Review No results found.   MDM   1. Hypermobility syndrome   2. Pes planus of both feet    Pt seen in conjunction w/ Dr. Teressa LowerE. Corey  Pes planus w/ foot strain after changing footwear. Concern for hypermobility syndrom given Beighton score. - Aspercreme and voltaren - OTC insoles  F/u [facility] for custom orthotics  Precautions given and all questions answered  Shelly Flattenavid Venkat Ankney, MD Family Medicine 02/09/2014, 5:43 PM    Ozella Rocksavid J Bradly Sangiovanni, MD 02/09/14 1743

## 2014-02-09 NOTE — ED Notes (Signed)
Pt states that he has a lump on his right foot it has been there for a month and has worsened over time. Pt is able to ambulate on it.

## 2014-02-09 NOTE — Discharge Instructions (Signed)
Your arch is collapsing and this may be due to hypermobility syndrome Please follow up with the sports medicine clinic for special insoles to help give your feet the support they need.  Please use the cream for pain and the diclofenac as needed

## 2014-02-28 ENCOUNTER — Encounter (HOSPITAL_COMMUNITY): Payer: Self-pay | Admitting: Emergency Medicine

## 2014-02-28 ENCOUNTER — Emergency Department (INDEPENDENT_AMBULATORY_CARE_PROVIDER_SITE_OTHER)
Admission: EM | Admit: 2014-02-28 | Discharge: 2014-02-28 | Disposition: A | Discharge status: Home / Self Care | Payer: BC Managed Care – PPO | Source: Home / Self Care | Attending: Family Medicine | Admitting: Family Medicine

## 2014-02-28 DIAGNOSIS — M67471 Ganglion, right ankle and foot: Secondary | ICD-10-CM | POA: Diagnosis not present

## 2014-02-28 MED ORDER — DICLOFENAC POTASSIUM 50 MG PO TABS: 50.0000 mg | ORAL_TABLET | Freq: Three times a day (TID) | ORAL | Status: DC

## 2014-02-28 NOTE — Discharge Instructions (Signed)
Use ankle wrap, arch support in work boots and medicine and ice as needed, see orthopedist if further problems.

## 2014-02-28 NOTE — ED Provider Notes (Addendum)
CSN: [id number]     Arrival date & time 02/28/14  1620 History   First MD Initiated Contact with Patient 02/28/14 1656     Chief Complaint  Patient presents with  . Foot Pain   (Consider location/radiation/quality/duration/timing/severity/associated sxs/prior Treatment) Patient is a 28 y.o. male presenting with lower extremity pain. The history is provided by the patient.  Foot Pain This is a recurrent problem. Episode onset: 4 weeks. The problem occurs constantly. The problem has been gradually worsening.    History reviewed. No pertinent past medical history. History reviewed. No pertinent past surgical history. History reviewed. No pertinent family history. History  Substance Use Topics  . Smoking status: Light Tobacco Smoker    Types: Cigarettes  . Smokeless tobacco: Not on file  . Alcohol Use: Yes     Comment: occassional    Review of Systems  Constitutional: Negative.   Musculoskeletal: Positive for joint swelling and gait problem. Negative for myalgias.  Skin: Negative.     Allergies  Review of patient's allergies indicates no known allergies.  Home Medications   Prior to Admission medications   Medication Sig Start Date End Date Taking? Authorizing Provider  diclofenac (CATAFLAM) 50 MG tablet Take 1 tablet (50 mg total) by mouth 3 (three) times daily. 02/28/14   Linna HoffJames D Myles Tavella, MD  diclofenac (VOLTAREN) 75 MG EC tablet Take 1 tablet (75 mg total) by mouth 2 (two) times daily. 02/09/14   Ozella Rocksavid J Merrell, MD  lidocaine (ASPERCREME W/LIDOCAINE) 4 % cream Apply 1 application topically as needed. 02/09/14   Ozella Rocksavid J Merrell, MD   BP 113/73 mmHg  Pulse 71  Temp(Src) 98.6 F (37 C) (Oral)  Resp 16  SpO2 100% Physical Exam  Constitutional: He is oriented to person, place, and time. He appears well-developed and well-nourished. No distress.  Musculoskeletal: He exhibits tenderness.       Feet:  Neurological: He is alert and oriented to person, place, and time.  Skin:  Skin is warm and dry.  Nursing note and vitals reviewed.   ED Course  Procedures (including critical care time) Labs Review Labs Reviewed - No data to display  Imaging Review No results found.   MDM   1. Ganglion cyst of right foot        Linna HoffJames D Irianna Gilday, MD 02/28/14 1725  Linna HoffJames D Dehlia Kilner, MD 02/28/14 [phone]

## 2014-02-28 NOTE — ED Notes (Signed)
Pt states that he was seen a few weeks ago for pain in his right foot arch. Pt states that the pain has gotten worse.

## 2015-07-08 ENCOUNTER — Ambulatory Visit (HOSPITAL_COMMUNITY)
Admission: EM | Admit: 2015-07-08 | Discharge: 2015-07-08 | Disposition: A | Discharge status: Home / Self Care | Payer: Self-pay | Attending: Emergency Medicine | Admitting: Emergency Medicine

## 2015-07-08 ENCOUNTER — Encounter (HOSPITAL_COMMUNITY): Payer: Self-pay | Admitting: Emergency Medicine

## 2015-07-08 DIAGNOSIS — M545 Low back pain, unspecified: Secondary | ICD-10-CM

## 2015-07-08 MED ORDER — PREDNISONE 50 MG PO TABS: ORAL_TABLET | ORAL | Status: DC

## 2015-07-08 NOTE — Discharge Instructions (Signed)
Your back pain is likely coming from the strain of lifting things at your job. I don't think you're going to slip a disc. Take prednisone daily for the next 5 days. Continue the stretching and heat. Take ibuprofen as needed for pain. I would recommend taking some before going to work. When you are at work, make sure you are lifting with your legs rather than your back. Follow-up as needed.

## 2015-07-08 NOTE — ED Provider Notes (Signed)
CSN: [id number]     Arrival date & time 07/08/15  1509 History   First MD Initiated Contact with Patient 07/08/15 1520     Chief Complaint  Patient presents with  . Back Pain   (Consider location/radiation/quality/duration/timing/severity/associated sxs/prior Treatment) HPI E is a 30 year old man here for evaluation of low back pain. He statesin 2010he had a slipped disc that caused significant pain and required an injection. Over the last 2 weeks he has started to feel some pressure and tightness in his lower back. 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 He denies any radiating pain. Pain is located across to his lower back. He states he has been doing stretching and heat with some improvement. His back tends to get worse after working. No bowel or bladder incontinence.  History reviewed. No pertinent past medical history. History reviewed. No pertinent past surgical history. History reviewed. No pertinent family history. Social History  Substance Use Topics  . Smoking status: Light Tobacco Smoker    Types: Cigarettes  . Smokeless tobacco: None  . Alcohol Use: Yes     Comment: occassional    Review of Systems as in history of present illness Allergies  Review of patient's allergies indicates no known allergies.  Home Medications   Prior to Admission medications   Medication Sig Start Date End Date Taking? Authorizing Provider  predniSONE (DELTASONE) 50 MG tablet Take 1 pill daily for 5 days. 07/08/15   Charm RingsErin J Alejandro Adcox, MD   Meds Ordered and Administered this Visit  Medications - No data to display  BP 126/74 mmHg  Pulse 80  Temp(Src) 99.5 F (37.5 C) (Oral)  Resp 18  SpO2 96% No data found.   Physical Exam  Constitutional: He is oriented to person, place, and time. He appears well-developed and well-nourished. No distress.  Cardiovascular: Normal rate.   Pulmonary/Chest: Effort normal.  Musculoskeletal:  Back: No erythema or edema. No  vertebral tenderness or step-offs. He does have some spasm of the lumbar parathoracic muscles, worse on the left side. Negative straight leg raise.  Neurological: He is alert and oriented to person, place, and time.    ED Course  Procedures (including critical care time)  Labs Review Labs Reviewed - No data to display  Imaging Review No results found.    MDM   1. Bilateral low back pain without sciatica    Suspect this is more muscle strain at this time rather than a bulging disc. Treat with prednisone for 5 days. Continue stretching and heat as needed. Recommended ibuprofen as needed for discomfort. Discussed proper lifting technique. Follow-up as needed.    Charm RingsErin J Zareth Rippetoe, MD 07/08/15 1539

## 2015-07-08 NOTE — ED Notes (Signed)
[facility] a [facility] pain that is associated to chronic back pain from a slipped disc that occurred in 2010.

## 2015-08-26 ENCOUNTER — Encounter (HOSPITAL_COMMUNITY): Payer: Self-pay | Admitting: Emergency Medicine

## 2015-08-26 ENCOUNTER — Ambulatory Visit (HOSPITAL_COMMUNITY)
Admission: EM | Admit: 2015-08-26 | Discharge: 2015-08-26 | Disposition: A | Discharge status: Home / Self Care | Payer: Self-pay | Attending: Family Medicine | Admitting: Family Medicine

## 2015-08-26 DIAGNOSIS — M545 Low back pain, unspecified: Secondary | ICD-10-CM

## 2015-08-26 DIAGNOSIS — G8929 Other chronic pain: Secondary | ICD-10-CM

## 2015-08-26 DIAGNOSIS — S39012S Strain of muscle, fascia and tendon of lower back, sequela: Secondary | ICD-10-CM

## 2015-08-26 MED ORDER — DICLOFENAC POTASSIUM 50 MG PO TABS: 50.0000 mg | ORAL_TABLET | Freq: Three times a day (TID) | ORAL | Status: DC

## 2015-08-26 MED ORDER — TRAMADOL-ACETAMINOPHEN 37.5-325 MG PO TABS: 1.0000 | ORAL_TABLET | Freq: Four times a day (QID) | ORAL | Status: DC | PRN
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
## 2015-12-21 ENCOUNTER — Ambulatory Visit (HOSPITAL_COMMUNITY)
Admission: EM | Admit: 2015-12-21 | Discharge: 2015-12-21 | Disposition: A | Discharge status: Home / Self Care | Payer: Self-pay | Attending: Emergency Medicine | Admitting: Emergency Medicine

## 2015-12-21 ENCOUNTER — Encounter (HOSPITAL_COMMUNITY): Payer: Self-pay | Admitting: Emergency Medicine

## 2015-12-21 DIAGNOSIS — M5441 Lumbago with sciatica, right side: Secondary | ICD-10-CM

## 2015-12-21 DIAGNOSIS — G8929 Other chronic pain: Secondary | ICD-10-CM

## 2015-12-21 MED ORDER — TRAMADOL-ACETAMINOPHEN 37.5-325 MG PO TABS: 1.0000 | ORAL_TABLET | Freq: Four times a day (QID) | ORAL | 0 refills | Status: DC | PRN

## 2015-12-21 MED ORDER — PREDNISOLONE 5 MG (48) PO TBPK: 5.0000 mg | ORAL_TABLET | ORAL | 0 refills | Status: AC

## 2015-12-21 NOTE — ED Provider Notes (Signed)
CSN: [id number]     Arrival date & time 12/21/15  1354 History   First MD Initiated Contact with Patient 12/21/15 1449     Chief Complaint  Patient presents with  . Back Pain   (Consider location/radiation/quality/duration/timing/severity/associated sxs/prior Treatment) [name] presents with acute on chronic back pain. He reports that he woke up this am with "tingling" in his posterior buttock and leg and it worried him. He reports ongoing and associated low back pain that "never stops". He denies extremity weakness. No loss of bowel or bladder control.  Pain with ROM and activity.       History reviewed. No pertinent past medical history. History reviewed. No pertinent surgical history. History reviewed. No pertinent family history. Social History  Substance Use Topics  . Smoking status: Light Tobacco Smoker    Types: Cigarettes  . Smokeless tobacco: Never Used  . Alcohol use Yes     Comment: occassional    Review of Systems  Genitourinary: Negative for difficulty urinating and dysuria.  Musculoskeletal: Positive for back pain.  Skin: Negative for rash.  Neurological: Positive for numbness. Negative for weakness.  Psychiatric/Behavioral: Negative.     Allergies  Review of patient's allergies indicates no known allergies.  Home Medications   Prior to Admission medications   Medication Sig Start Date End Date Taking? Authorizing Provider  diclofenac (CATAFLAM) 50 MG tablet Take 1 tablet (50 mg total) by mouth 3 (three) times daily. One tablet TID with food prn pain. 08/26/15   Hayden Rasmussenavid Mabe, NP  PrednisoLONE 5 MG (48) TBPK Take 5 mg by mouth as directed. 12/21/15 01/02/16  Riki SheerMichelle G Young, PA-C  traMADol-acetaminophen (ULTRACET) 37.5-325 MG tablet Take 1 tablet by mouth every 6 (six) hours as needed. 12/21/15   Riki SheerMichelle G Young, PA-C   Meds Ordered and Administered this Visit  Medications - No data to display  BP 102/57 (BP Location: Left Arm)   Pulse 71   Temp 98.4  F (36.9 C) (Oral)   Resp 12   SpO2 99%  No data found.   Physical Exam  Constitutional: He appears well-developed and well-nourished. No distress.  Musculoskeletal: Normal range of motion. He exhibits tenderness. He exhibits no deformity.  Full ROM of the low back, pain along the right para spinal lumbar region.   Neurological: He is alert. He displays normal reflexes. He exhibits normal muscle tone. Coordination normal.  Skin: Skin is warm and dry. He is not diaphoretic.  Psychiatric: His behavior is normal.  Nursing note and vitals reviewed.   Urgent Care Course   Clinical Course    Procedures (including critical care time)  Labs Review Labs Reviewed - No data to display  Imaging Review No results found.   Visual Acuity Review  Right Eye Distance:   Left Eye Distance:   Bilateral Distance:    Right Eye Near:   Left Eye Near:    Bilateral Near:         MDM   1. Chronic right-sided low back pain with right-sided sciatica    No neuro deficit is noted. Treat acutely with Pred pack and Tramadol. He is educated today re: establishing care with an Orthopedic for possible PT and back training to avoid re injury to his back. Some suggestions are given Orthopedics.     Riki SheerMichelle G Young, PA-C 12/21/15 1549
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
## 2015-12-21 NOTE — Discharge Instructions (Signed)
Take the prednisone pack for pain and inflammation. Use the pain medication only as needed. Suggest you make an appt with Ortho as has been instructed in the past, they can help you more than we can.

## 2016-09-01 ENCOUNTER — Ambulatory Visit (HOSPITAL_COMMUNITY)
Admission: EM | Admit: 2016-09-01 | Discharge: 2016-09-01 | Disposition: A | Discharge status: Home / Self Care | Payer: Self-pay | Attending: Family Medicine | Admitting: Family Medicine

## 2016-09-01 ENCOUNTER — Encounter (HOSPITAL_COMMUNITY): Payer: Self-pay | Admitting: Emergency Medicine

## 2016-09-01 DIAGNOSIS — J309 Allergic rhinitis, unspecified: Secondary | ICD-10-CM

## 2016-09-01 DIAGNOSIS — G44209 Tension-type headache, unspecified, not intractable: Secondary | ICD-10-CM

## 2016-09-01 MED ORDER — DEXAMETHASONE SODIUM PHOSPHATE 10 MG/ML IJ SOLN
INTRAMUSCULAR | Status: AC
  Filled 2016-09-01: qty 1

## 2016-09-01 MED ORDER — ALBUTEROL SULFATE HFA 108 (90 BASE) MCG/ACT IN AERS: 2.0000 | INHALATION_SPRAY | RESPIRATORY_TRACT | 0 refills | Status: DC | PRN

## 2016-09-01 MED ORDER — KETOROLAC TROMETHAMINE 60 MG/2ML IM SOLN
45.0000 mg | Freq: Once | INTRAMUSCULAR | Status: AC
  Administered 2016-09-01: 45 mg via INTRAMUSCULAR

## 2016-09-01 MED ORDER — PREDNISONE 20 MG PO TABS: ORAL_TABLET | ORAL | 0 refills | Status: DC

## 2016-09-01 MED ORDER — DEXAMETHASONE SODIUM PHOSPHATE 10 MG/ML IJ SOLN
10.0000 mg | Freq: Once | INTRAMUSCULAR | Status: AC
  Administered 2016-09-01: 10 mg via INTRAMUSCULAR

## 2016-09-01 MED ORDER — KETOROLAC TROMETHAMINE 60 MG/2ML IM SOLN
INTRAMUSCULAR | Status: AC
  Filled 2016-09-01: qty 2

## 2016-09-01 NOTE — ED Provider Notes (Signed)
CSN: [id number]     Arrival date & time 09/01/16  1857 History   First MD Initiated Contact with Patient 09/01/16 1959     Chief Complaint  Patient presents with  . URI   (Consider location/radiation/quality/duration/timing/severity/associated sxs/prior Treatment) 31 year old male complaining of a 2 day history of pressure in the maxillary sinuses, the temples and the occipital condyles and posterior neck musculature. He developed a scratchy throat initially but now with the pressure in the face of the headache and other allergy symptoms he states that he is feeling worse. Denies fever or chills. He is taking some medication but insufficient for his symptoms. No head trauma history.      History reviewed. No pertinent past medical history. History reviewed. No pertinent surgical history. History reviewed. No pertinent family history. Social History  Substance Use Topics  . Smoking status: Light Tobacco Smoker    Types: Cigarettes  . Smokeless tobacco: Never Used  . Alcohol use Yes     Comment: occassional    Review of Systems  Constitutional: Positive for activity change. Negative for diaphoresis, fatigue and fever.  HENT: Positive for postnasal drip and sore throat. Negative for ear pain, facial swelling, rhinorrhea and trouble swallowing.   Eyes: Negative for pain, discharge and redness.  Respiratory: Negative for cough, chest tightness and shortness of breath.   Cardiovascular: Negative.   Gastrointestinal: Negative.   Genitourinary: Negative.   Musculoskeletal: Positive for neck pain. Negative for back pain and neck stiffness.  Neurological: Positive for headaches. Negative for tremors, seizures, syncope, facial asymmetry and speech difficulty.  All other systems reviewed and are negative.   Allergies  Patient has no known allergies.  Home Medications   Prior to Admission medications   Medication Sig Start Date End Date Taking? Authorizing Provider  diclofenac  (CATAFLAM) 50 MG tablet Take 1 tablet (50 mg total) by mouth 3 (three) times daily. One tablet TID with food prn pain. 08/26/15   Hayden RasmussenMabe, Tomoki Lucken, NP  predniSONE (DELTASONE) 20 MG tablet 3 Tabs PO Days 1-3, then 2 tabs PO Days 4-6, then 1 tab PO Day 7-9, then Half Tab PO Day 10-12 09/01/16   Hayden RasmussenMabe, Lincy Belles, NP  traMADol-acetaminophen (ULTRACET) 37.5-325 MG tablet Take 1 tablet by mouth every 6 (six) hours as needed. 12/21/15   Riki SheerYoung, Michelle G, PA-C   Meds Ordered and Administered this Visit   Medications  dexamethasone (DECADRON) injection 10 mg (not administered)  ketorolac (TORADOL) injection 45 mg (not administered)    BP 122/68 (BP Location: Right Arm)   Pulse 87   Temp 99.6 F (37.6 C) (Oral)   Resp 18   SpO2 100%  No data found.   Physical Exam  Constitutional: He is oriented to person, place, and time. He appears well-developed and well-nourished. No distress.  HENT:  Head: Normocephalic and atraumatic.  Mouth/Throat: No oropharyngeal exudate.  Tenderness to the paranasal and maxillary sinuses. Tenderness over the temples. Oropharynx with small amount of clear PND otherwise clear. Tender to the posterior neck musculature and attachments points to the occiput.  Eyes: EOM are normal.  Neck: Normal range of motion. Neck supple.  Cardiovascular: Normal rate, regular rhythm and normal heart sounds.   Pulmonary/Chest: Effort normal and breath sounds normal. No respiratory distress.  Faint wheezes.  Musculoskeletal: Normal range of motion. He exhibits no edema.  Lymphadenopathy:    He has no cervical adenopathy.  Neurological: He is alert and oriented to person, place, and time.  Skin: Skin is warm and  dry. No rash noted.  Psychiatric: He has a normal mood and affect.  Nursing note and vitals reviewed.   Urgent Care Course     Procedures (including critical care time)  Labs Review Labs Reviewed - No data to display  Imaging Review No results found.   Visual Acuity  Review  Right Eye Distance:   Left Eye Distance:   Bilateral Distance:    Right Eye Near:   Left Eye Near:    Bilateral Near:         MDM   1. Allergic sinusitis   2. Acute non intractable tension-type headache    Take the prednisone daily as directed. Take it with food. Continue taking the Allegra and take Sudafed PE 10 mg every 4-6 hours. Drink plenty of fluids stay well-hydrated Meds ordered this encounter  Medications  . dexamethasone (DECADRON) injection 10 mg  . ketorolac (TORADOL) injection 45 mg  . predniSONE (DELTASONE) 20 MG tablet    Sig: 3 Tabs PO Days 1-3, then 2 tabs PO Days 4-6, then 1 tab PO Day 7-9, then Half Tab PO Day 10-12    Dispense:  20 tablet    Refill:  0    Order Specific Question:   Supervising Provider    Answer:   Elvina Sidle [[id number]]   Albuterol HFA 2 puffs every 4 hours when necessary cough and wheeze e scribed    Hayden Rasmussen, NP 09/01/16 2035

## 2016-09-01 NOTE — ED Triage Notes (Signed)
THROBBING HEADACHE AND SINUS CONGESTION FOR 2 DAYS

## 2016-09-01 NOTE — Discharge Instructions (Signed)
Take the prednisone daily as directed. Take it with food. Continue taking the Allegra and take Sudafed PE 10 mg every 4-6 hours. Drink plenty of fluids stay well-hydrated

## 2018-05-03 ENCOUNTER — Ambulatory Visit (HOSPITAL_COMMUNITY)
Admission: EM | Admit: 2018-05-03 | Discharge: 2018-05-03 | Disposition: A | Discharge status: Home / Self Care | Payer: Self-pay | Attending: Family Medicine | Admitting: Family Medicine

## 2018-05-03 ENCOUNTER — Encounter (HOSPITAL_COMMUNITY): Payer: Self-pay | Admitting: Emergency Medicine

## 2018-05-03 DIAGNOSIS — G8929 Other chronic pain: Secondary | ICD-10-CM

## 2018-05-03 DIAGNOSIS — M545 Low back pain, unspecified: Secondary | ICD-10-CM

## 2018-05-03 MED ORDER — CYCLOBENZAPRINE HCL 10 MG PO TABS: 10.0000 mg | ORAL_TABLET | Freq: Two times a day (BID) | ORAL | 0 refills | Status: DC | PRN

## 2018-05-03 NOTE — ED Triage Notes (Signed)
Pt c/o chronic back pain for years, states it gets "flaired up", c/o back pain with twisting, states he woke up this morning with back pain.

## 2018-05-03 NOTE — Discharge Instructions (Addendum)
Flexeril as needed at bedtime to loosen muscles.  Tylenol or aleve during the day for pain and inflammation.  Referral to physical therapy given Follow up as needed for continued or worsening symptoms

## 2018-05-03 NOTE — ED Provider Notes (Signed)
[facility]    CSN: [id number] Arrival date & time: 05/03/18  1502     History   Chief Complaint Chief Complaint  Patient presents with  . Back Pain    HPI Scott Black is a 33 y.o. male.   Is a 33 year old male presents today with lower back pain.  This is a chronic issue for him that flares up from time to time.  He reports this instance that he slept on a couch last night and woke up with tightness in the lower back area.  The pain is worse with twisting and moving certain directions.  He did take some Tylenol back and body with some relief of his pain.  He denies any associated numbness, tingling, radiation of pain, weakness, saddle paresthesias or loss of bowel or bladder function.  No fevers, chills or urinary issues.  Patient reporting history of herniated disc.  Reports this is not feel the same and the pain is not as intense.  ROS per HPI      History reviewed. No pertinent past medical history.  There are no active problems to display for this patient.   History reviewed. No pertinent surgical history.     Home Medications    Prior to Admission medications   Medication Sig Start Date End Date Taking? Authorizing Provider  albuterol (PROVENTIL HFA;VENTOLIN HFA) 108 (90 Base) MCG/ACT inhaler Inhale 2 puffs into the lungs every 4 (four) hours as needed for wheezing or shortness of breath. 09/01/16   Hayden Rasmussen, NP  cyclobenzaprine (FLEXERIL) 10 MG tablet Take 1 tablet (10 mg total) by mouth 2 (two) times daily as needed for muscle spasms. 05/03/18   Janace Aris, NP    Family History No family history on file.  Social History Social History   Tobacco Use  . Smoking status: Light Tobacco Smoker    Types: Cigarettes  . Smokeless tobacco: Never Used  Substance Use Topics  . Alcohol use: Yes    Comment: occassional  . Drug use: No     Allergies   Patient has no known allergies.   Review of Systems Review of Systems   Physical  Exam Triage Vital Signs ED Triage Vitals  Enc Vitals Group     BP 05/03/18 1529 120/78     Pulse Rate 05/03/18 1529 85     Resp 05/03/18 1529 18     Temp 05/03/18 1529 98.7 F (37.1 C)     Temp src --      SpO2 05/03/18 1529 100 %     Weight --      Height --      Head Circumference --      Peak Flow --      Pain Score 05/03/18 1530 7     Pain Loc --      Pain Edu? --      Excl. in GC? --    No data found.  Updated Vital Signs BP 120/78   Pulse 85   Temp 98.7 F (37.1 C)   Resp 18   SpO2 100%   Visual Acuity Right Eye Distance:   Left Eye Distance:   Bilateral Distance:    Right Eye Near:   Left Eye Near:    Bilateral Near:     Physical Exam Vitals signs and nursing note reviewed.  Constitutional:      General: He is not in acute distress.    Appearance: Normal appearance. He is not ill-appearing, toxic-appearing  or diaphoretic.  HENT:     Head: Normocephalic and atraumatic.     Nose: Nose normal.  Neck:     Musculoskeletal: Normal range of motion.  Pulmonary:     Effort: Pulmonary effort is normal.  Musculoskeletal: Normal range of motion.        General: Swelling and tenderness present. No deformity or signs of injury.     Right lower leg: No edema.     Left lower leg: No edema.     Comments: Tenderness to lower lumbar paravertebral musculature more only left side.  Mild swelling to area.  No deformities, erythema, or bruising.  Skin:    General: Skin is warm and dry.     Findings: No rash.  Neurological:     Mental Status: He is alert.  Psychiatric:        Mood and Affect: Mood normal.        Behavior: Behavior normal.      UC Treatments / Results  Labs (all labs ordered are listed, but only abnormal results are displayed) Labs Reviewed - No data to display  EKG None  Radiology No results found.  Procedures Procedures (including critical care time)  Medications Ordered in UC Medications - No data to display  Initial Impression /  Assessment and Plan / UC Course  I have reviewed the triage vital signs and the nursing notes.  Pertinent labs & imaging results that were available during my care of the patient were reviewed by me and considered in my medical decision making (see chart for details).     Most likely lower lumbar strain with muscle spasm. Most likely due to sleeping on a couch last night. The Tylenol is helping with most of his symptoms. He is requesting referral to physical therapy Referral sent Low-dose muscle relaxer prescribed to take at bedtime as needed Gentle stretching, heat, massage to the area Follow up as needed for continued or worsening symptoms  Final Clinical Impressions(s) / UC Diagnoses   Final diagnoses:  Chronic bilateral low back pain without sciatica     Discharge Instructions     Flexeril as needed at bedtime to loosen muscles.  Tylenol or aleve during the day for pain and inflammation.  Referral to physical therapy given Follow up as needed for continued or worsening symptoms     ED Prescriptions    Medication Sig Dispense Auth. Provider   cyclobenzaprine (FLEXERIL) 10 MG tablet Take 1 tablet (10 mg total) by mouth 2 (two) times daily as needed for muscle spasms. 20 tablet Dahlia Byes A, NP     Controlled Substance Prescriptions [place] Controlled Substance Registry consulted? Not Applicable   Janace Aris, NP 05/03/18 1556

## 2018-10-25 ENCOUNTER — Other Ambulatory Visit: Payer: Self-pay

## 2018-10-25 DIAGNOSIS — Z20822 Contact with and (suspected) exposure to covid-19: Secondary | ICD-10-CM

## 2018-10-27 LAB — NOVEL CORONAVIRUS, NAA: SARS-CoV-2, NAA: NOT DETECTED

## 2020-01-27 ENCOUNTER — Other Ambulatory Visit: Payer: Self-pay

## 2020-01-27 DIAGNOSIS — Z20822 Contact with and (suspected) exposure to covid-19: Secondary | ICD-10-CM

## 2020-01-28 ENCOUNTER — Ambulatory Visit (HOSPITAL_COMMUNITY)
Admission: EM | Admit: 2020-01-28 | Discharge: 2020-01-28 | Disposition: A | Discharge status: Home / Self Care | Payer: Self-pay | Attending: Family Medicine | Admitting: Family Medicine

## 2020-01-28 ENCOUNTER — Other Ambulatory Visit: Payer: Self-pay

## 2020-01-28 ENCOUNTER — Encounter (HOSPITAL_COMMUNITY): Payer: Self-pay | Admitting: Emergency Medicine

## 2020-01-28 DIAGNOSIS — R519 Headache, unspecified: Secondary | ICD-10-CM

## 2020-01-28 MED ORDER — KETOROLAC TROMETHAMINE 60 MG/2ML IM SOLN
60.0000 mg | Freq: Once | INTRAMUSCULAR | Status: AC
  Administered 2020-01-28: 60 mg via INTRAMUSCULAR

## 2020-01-28 MED ORDER — KETOROLAC TROMETHAMINE 60 MG/2ML IM SOLN
INTRAMUSCULAR | Status: AC
  Filled 2020-01-28: qty 2

## 2020-01-28 NOTE — ED Provider Notes (Signed)
[facility]   [id number] 01/28/20 Arrival Time: 1100  ASSESSMENT & PLAN:  1. Bad headache     Declines COVID testing here today. Reports having a pending test at outside facility.  Given: Meds ordered this encounter  Medications  . ketorolac (TORADOL) injection 60 mg   Normal neurological exam. Afebrile without nuchal rigidity. Without fever, focal neurological deficits, nuchal rigidity, or change in vision. No indication for neurodiagnostic workup at this time. Discussed.  Recommend:  Follow-up Information    [facility] EMERGENCY DEPARTMENT.   Specialty: Emergency Medicine Why: If worsening or failing to improve as anticipated. Contact information: [id number] [place] [address] [phone]                Discharge Instructions      Meds ordered this encounter  Medications  . ketorolac (TORADOL) injection 60 mg    Please seek prompt medical care if:  You have: ? A very bad (severe) headache that is not helped by medicine. ? Trouble walking or weakness in your arms and legs. ? Clear or bloody fluid coming from your nose or ears. ? Changes in your seeing (vision). ? Jerky movements that you cannot control (seizure).  You throw up again because of your headache (vomit).  Your symptoms get worse.  You lose balance.  Your speech is slurred.  You pass out.  You are sleepier and have trouble staying awake.  The black centers of your eyes (pupils) change in size.  These symptoms may be an emergency. Do not wait to see if the symptoms will go away. Get medical help right away. Call your local emergency services. Do not drive yourself to the hospital.      Reviewed expectations re: course of current medical issues. Questions answered. Outlined signs and symptoms indicating need for more acute intervention. Patient verbalized understanding. After Visit Summary  given.   SUBJECTIVE: History from: Patient. Patient is able to give a clear and coherent history.  Yecheskel Kurek is a 34 y.o. male who presents with complaint of a "migraine" headache. Onset gradual, two d ago; off and on. Location: frontal but now feels it around side of head into his neck  H/O similar headaches; this just lasting longer than usual. Associated symptoms: Preceding aura: no. Nausea/vomiting: one emesis last evening; none since; no current nausea. Vision changes: no. Increased sensitivity to light and to noises: yes. Fever: no. Sinus pressure/congestion: no. Extremity weakness: no. Home treatment has included Ibuprofen with little improvement. Current headache has not limited normal daily activities. Denies dizziness, loss of balance, muscle weakness, numbness of extremities and speech difficulties. No head injury reported. Ambulatory without difficulty. No recent travel.   OBJECTIVE:  Vitals:   01/28/20 1218  BP: 136/73  Pulse: 76  Resp: 17  Temp: 98.8 F (37.1 C)  TempSrc: Oral  SpO2: 100%    General appearance: alert; NAD HENT: normocephalic; atraumatic Eyes: PERRLA; EOMI; conjunctivae normal Neck: supple with FROM Lungs: speaks full sentences without difficulty; unlabored respirations Extremities: no edema; symmetrical with no gross deformities Skin: warm and dry Neurologic: alert; speech is fluent and clear without dysarthria or aphasia; CN 2-12 grossly intact   No Known Allergies  History reviewed. No pertinent past medical history. Social History   Socioeconomic History  . Marital status: Single    Spouse name: Not on file  . Number of children: Not on file  . Years of education: Not on file  . Highest  education level: Not on file  Occupational History  . Not on file  Tobacco Use  . Smoking status: Light Tobacco Smoker    Types: Cigarettes  . Smokeless tobacco: Never Used  Substance and Sexual Activity  . Alcohol use: Yes     Comment: occassional  . Drug use: No  . Sexual activity: Yes    Birth control/protection: Condom  Other Topics Concern  . Not on file  Social History Narrative  . Not on file   Social Determinants of Health   Financial Resource Strain:   . Difficulty of Paying Living Expenses: Not on file  Food Insecurity:   . Worried About Programme researcher, broadcasting/film/video in the Last Year: Not on file  . Ran Out of Food in the Last Year: Not on file  Transportation Needs:   . Lack of Transportation (Medical): Not on file  . Lack of Transportation (Non-Medical): Not on file  Physical Activity:   . Days of Exercise per Week: Not on file  . Minutes of Exercise per Session: Not on file  Stress:   . Feeling of Stress : Not on file  Social Connections:   . Frequency of Communication with Friends and Family: Not on file  . Frequency of Social Gatherings with Friends and Family: Not on file  . Attends Religious Services: Not on file  . Active Member of Clubs or Organizations: Not on file  . Attends Banker Meetings: Not on file  . Marital Status: Not on file  Intimate Partner Violence:   . Fear of Current or Ex-Partner: Not on file  . Emotionally Abused: Not on file  . Physically Abused: Not on file  . Sexually Abused: Not on file   Family History  Problem Relation Age of Onset  . Healthy Mother   . Healthy Father    History reviewed. No pertinent surgical history.   Mardella Layman, MD 01/28/20 1304

## 2020-01-28 NOTE — Discharge Instructions (Addendum)
Meds ordered this encounter  Medications   ketorolac (TORADOL) injection 60 mg    Please seek prompt medical care if: You have: A very bad (severe) headache that is not helped by medicine. Trouble walking or weakness in your arms and legs. Clear or bloody fluid coming from your nose or ears. Changes in your seeing (vision). Jerky movements that you cannot control (seizure). You throw up again because of your headache (vomit). Your symptoms get worse. You lose balance. Your speech is slurred. You pass out. You are sleepier and have trouble staying awake. The black centers of your eyes (pupils) change in size.  These symptoms may be an emergency. Do not wait to see if the symptoms will go away. Get medical help right away. Call your local emergency services. Do not drive yourself to the hospital.

## 2020-01-28 NOTE — ED Triage Notes (Signed)
Pt c/o migraine onset Thursday. Pt states he has sensitivity to light and sound. The pain is worse with movement. Pt states last night he felt nauseous due to pain. He states he has been taking ibuprofen and Excedrin with no relief.

## 2020-01-29 LAB — NOVEL CORONAVIRUS, NAA: SARS-CoV-2, NAA: NOT DETECTED

## 2020-01-29 LAB — SARS-COV-2, NAA 2 DAY TAT

## 2020-01-30 ENCOUNTER — Other Ambulatory Visit: Payer: Self-pay

## 2020-01-30 ENCOUNTER — Emergency Department (HOSPITAL_COMMUNITY)
Admission: EM | Admit: 2020-01-30 | Discharge: 2020-01-30 | Disposition: A | Discharge status: Home / Self Care | Payer: Self-pay | Attending: Emergency Medicine | Admitting: Emergency Medicine

## 2020-01-30 ENCOUNTER — Encounter (HOSPITAL_COMMUNITY): Payer: Self-pay

## 2020-01-30 ENCOUNTER — Emergency Department (HOSPITAL_COMMUNITY): Payer: Self-pay

## 2020-01-30 ENCOUNTER — Encounter (HOSPITAL_COMMUNITY): Payer: Self-pay | Admitting: Emergency Medicine

## 2020-01-30 DIAGNOSIS — R519 Headache, unspecified: Secondary | ICD-10-CM | POA: Insufficient documentation

## 2020-01-30 DIAGNOSIS — H5319 Other subjective visual disturbances: Secondary | ICD-10-CM | POA: Insufficient documentation

## 2020-01-30 DIAGNOSIS — Z5321 Procedure and treatment not carried out due to patient leaving prior to being seen by health care provider: Secondary | ICD-10-CM | POA: Insufficient documentation

## 2020-01-30 DIAGNOSIS — R9431 Abnormal electrocardiogram [ECG] [EKG]: Secondary | ICD-10-CM | POA: Insufficient documentation

## 2020-01-30 DIAGNOSIS — R112 Nausea with vomiting, unspecified: Secondary | ICD-10-CM | POA: Insufficient documentation

## 2020-01-30 DIAGNOSIS — Z20822 Contact with and (suspected) exposure to covid-19: Secondary | ICD-10-CM | POA: Insufficient documentation

## 2020-01-30 DIAGNOSIS — F17210 Nicotine dependence, cigarettes, uncomplicated: Secondary | ICD-10-CM | POA: Insufficient documentation

## 2020-01-30 DIAGNOSIS — Z8669 Personal history of other diseases of the nervous system and sense organs: Secondary | ICD-10-CM | POA: Insufficient documentation

## 2020-01-30 LAB — CBC
HCT: 42.4 % (ref 39.0–52.0)
Hemoglobin: 13.8 g/dL (ref 13.0–17.0)
MCH: 29.2 pg (ref 26.0–34.0)
MCHC: 32.5 g/dL (ref 30.0–36.0)
MCV: 89.8 fL (ref 80.0–100.0)
Platelets: 177 10*3/uL (ref 150–400)
RBC: 4.72 MIL/uL (ref 4.22–5.81)
RDW: 13.5 % (ref 11.5–15.5)
WBC: 5.6 10*3/uL (ref 4.0–10.5)
nRBC: 0 % (ref 0.0–0.2)

## 2020-01-30 LAB — COMPREHENSIVE METABOLIC PANEL
ALT: 15 U/L (ref 0–44)
AST: 17 U/L (ref 15–41)
Albumin: 4.5 g/dL (ref 3.5–5.0)
Alkaline Phosphatase: 51 U/L (ref 38–126)
Anion gap: 9 (ref 5–15)
BUN: 13 mg/dL (ref 6–20)
CO2: 27 mmol/L (ref 22–32)
Calcium: 8.9 mg/dL (ref 8.9–10.3)
Chloride: 103 mmol/L (ref 98–111)
Creatinine, Ser: 1.06 mg/dL (ref 0.61–1.24)
GFR, Estimated: >60 mL/min (ref >60)
Glucose, Bld: 96 mg/dL (ref 70–99)
Potassium: 3.4 mmol/L — ABNORMAL LOW (ref 3.5–5.1)
Sodium: 139 mmol/L (ref 135–145)
Total Bilirubin: 0.9 mg/dL (ref 0.3–1.2)
Total Protein: 7.2 g/dL (ref 6.5–8.1)

## 2020-01-30 LAB — RESP PANEL BY RT-PCR (FLU A&B, COVID) ARPGX2
Influenza A by PCR: NEGATIVE
Influenza B by PCR: NEGATIVE
SARS Coronavirus 2 by RT PCR: NEGATIVE

## 2020-01-30 MED ORDER — DEXAMETHASONE SODIUM PHOSPHATE 4 MG/ML IJ SOLN
4.0000 mg | Freq: Once | INTRAMUSCULAR | Status: AC
  Administered 2020-01-30: 4 mg via INTRAVENOUS
  Filled 2020-01-30: qty 1

## 2020-01-30 MED ORDER — KETOROLAC TROMETHAMINE 30 MG/ML IJ SOLN
15.0000 mg | Freq: Once | INTRAMUSCULAR | Status: AC
  Administered 2020-01-30: 15 mg via INTRAVENOUS
  Filled 2020-01-30: qty 1

## 2020-01-30 MED ORDER — SODIUM CHLORIDE 0.9 % IV BOLUS
1000.0000 mL | Freq: Once | INTRAVENOUS | Status: AC
  Administered 2020-01-30: 1000 mL via INTRAVENOUS

## 2020-01-30 MED ORDER — PROMETHAZINE HCL 25 MG/ML IJ SOLN
25.0000 mg | Freq: Once | INTRAMUSCULAR | Status: AC
  Administered 2020-01-30: 25 mg via INTRAVENOUS
  Filled 2020-01-30: qty 1

## 2020-01-30 MED ORDER — DIPHENHYDRAMINE HCL 50 MG/ML IJ SOLN
25.0000 mg | Freq: Once | INTRAMUSCULAR | Status: AC
  Administered 2020-01-30: 25 mg via INTRAVENOUS
  Filled 2020-01-30: qty 1

## 2020-01-30 NOTE — ED Triage Notes (Signed)
Hx of migraines, states headache since Thursday, seen at urgent care 2 days ago, given migraine cocktail which helped for a day, headache then returned. Endorses some n/v. Has had recent negative covid test.

## 2020-01-30 NOTE — ED Notes (Signed)
Pt called for vital recheck 2x no response

## 2020-01-30 NOTE — ED Triage Notes (Signed)
Pt arrived via walk in, c/o migraine since Thursday. Seen at urgent care x2 days ago, given migraine cocktail with little relief for that day.

## 2020-01-30 NOTE — ED Notes (Signed)
Assumed care of patient at this time, nad noted, sr up x2, bed locked and low, call bell w/I reach.  Will continue to monitor. ° °

## 2020-01-30 NOTE — Discharge Instructions (Addendum)
Your test for Covid, flu A, and flu B were all negative.  Today your EKG, which is the electrical tracing of your heart, was not fully normal.  I would recommend that you call cardiology to set up a follow-up appointment with them.    While in the emergency room your blood pressure was also slightly elevated.  This may be due to pain or the stress of being in the emergency room, however I would recommend that you get this rechecked in the next few weeks.  I have given you follow-up with a wellness clinic.  Please call them to set up a follow-up appointment.  Untreated high blood pressure can cause damage to many of the major organs even if you do not feel any symptoms.

## 2020-01-30 NOTE — ED Provider Notes (Signed)
[facility]-EMERGENCY DEPT Provider Note   CSN: [id number] Arrival date & time: 01/30/20  1809     History Chief Complaint  Patient presents with  . Migraine    Scott Black is a 34 y.o. male with no pertinent past medical history who presents today for evaluation of headache. He reports that for the past 5 days he has had bitemporal headache. He was seen at urgent care 2 days ago, at that point he was treated with IM Toradol. He reports that that helped his pain for "a few hours" however after that his pain returned. He denies any vision changes. No weakness. He denies a previous diagnosis of migraines. He did not have any aura or prodromal symptoms. He states that he has had a negative Covid test since his symptoms started, however he also reports body aches, and nausea. He states he has vomited 3 times since his headache started.  No abdominal pain.  He denies any neck pain or chiropractic adjustments. He reports phonophobia and photophobia.    HPI     History reviewed. No pertinent past medical history.  There are no problems to display for this patient.   History reviewed. No pertinent surgical history.     Family History  Problem Relation Age of Onset  . Healthy Mother   . Healthy Father     Social History   Tobacco Use  . Smoking status: Light Tobacco Smoker    Types: Cigarettes  . Smokeless tobacco: Never Used  Substance Use Topics  . Alcohol use: Yes    Comment: occassional  . Drug use: No    Home Medications Prior to Admission medications   Medication Sig Start Date End Date Taking? Authorizing Provider  Acetaminophen-Caffeine (EXCEDRIN TENSION HEADACHE) 500-65 MG TABS Take 2 tablets by mouth daily as needed (headache).   Yes [provider]  ibuprofen (ADVIL) 800 MG tablet Take 800 mg by mouth every 8 (eight) hours as needed for headache.   Yes [provider]  albuterol (PROVENTIL HFA;VENTOLIN HFA) 108 (90 Base)  MCG/ACT inhaler Inhale 2 puffs into the lungs every 4 (four) hours as needed for wheezing or shortness of breath. Patient not taking: Reported on 01/30/2020 09/01/16   Hayden Rasmussen, NP  cyclobenzaprine (FLEXERIL) 10 MG tablet Take 1 tablet (10 mg total) by mouth 2 (two) times daily as needed for muscle spasms. Patient not taking: Reported on 01/30/2020 05/03/18   Janace Aris, NP    Allergies    Patient has no known allergies.  Review of Systems   Review of Systems  Constitutional: Negative for chills and fever.  HENT: Negative for congestion.   Eyes: Positive for photophobia.  Respiratory: Negative for cough and shortness of breath.   Gastrointestinal: Positive for nausea and vomiting. Negative for abdominal pain, constipation and diarrhea.  Musculoskeletal: Negative for back pain and neck pain.  Skin: Negative for color change.  Neurological: Positive for headaches. Negative for weakness.  Psychiatric/Behavioral: Negative for confusion.  All other systems reviewed and are negative.   Physical Exam Updated Vital Signs BP (!) 139/92   Pulse 67   Temp 98.8 F (37.1 C) (Oral)   Resp 17   Ht 6' (1.829 m)   Wt 79.4 kg   SpO2 97%   BMI 23.73 kg/m   Physical Exam Vitals and nursing note reviewed.  Constitutional:      General: He is not in acute distress.    Appearance: He is well-developed. He is  not diaphoretic.  HENT:     Head: Normocephalic and atraumatic.     Mouth/Throat:     Pharynx: Oropharynx is clear.  Eyes:     General: No scleral icterus.       Right eye: No discharge.        Left eye: No discharge.     Conjunctiva/sclera: Conjunctivae normal.  Cardiovascular:     Rate and Rhythm: Normal rate and regular rhythm.  Pulmonary:     Effort: Pulmonary effort is normal. No respiratory distress.     Breath sounds: No stridor.  Abdominal:     General: There is no distension.  Musculoskeletal:        General: No deformity.     Cervical back: Normal range of  motion.     Right lower leg: No edema.     Left lower leg: No edema.     Comments: No neck pain, palpation over the occiput bilaterally improves his headache.   Skin:    General: Skin is warm and dry.  Neurological:     Mental Status: He is alert and oriented to person, place, and time.     Motor: No abnormal muscle tone.     Comments: Mental Status:  Alert, oriented, thought content appropriate, able to give a coherent history. Speech fluent without evidence of aphasia. Able to follow 2 step commands without difficulty.  Cranial Nerves:  II:  Peripheral visual fields grossly normal, pupils equal, round, reactive to light III,IV, VI: ptosis not present, extra-ocular motions intact bilaterally  V,VII: smile symmetric, facial light touch sensation equal VIII: hearing grossly normal to voice  X: uvula elevates symmetrically  XI: bilateral shoulder shrug symmetric and strong XII: midline tongue extension without fassiculations Motor:  Normal tone. 5/5 in upper and lower extremities bilaterally including strong and equal grip strength and dorsiflexion/plantar flexion Gait: normal gait and balance CV: distal pulses palpable throughout   Psychiatric:        Behavior: Behavior normal.     ED Results / Procedures / Treatments   Labs (all labs ordered are listed, but only abnormal results are displayed) Labs Reviewed  COMPREHENSIVE METABOLIC PANEL - Abnormal; Notable for the following components:      Result Value   Potassium 3.4 (*)    All other components within normal limits  RESP PANEL BY RT-PCR (FLU A&B, COVID) [id number]  CBC    EKG None  Radiology CT Head Wo Contrast  Result Date: 01/30/2020 CLINICAL DATA:  Headaches. EXAM: CT HEAD WITHOUT CONTRAST TECHNIQUE: Contiguous axial images were obtained from the base of the skull through the vertex without intravenous contrast. COMPARISON:  None. FINDINGS: Brain: No evidence of acute infarction, hemorrhage, hydrocephalus, extra-axial  collection or mass lesion/mass effect. Vascular: No hyperdense vessel or unexpected calcification. Skull: Normal. Negative for fracture or focal lesion. Sinuses/Orbits: No acute finding. Other: None. IMPRESSION: No acute intracranial pathology. Electronically Signed   By: Aram Candela M.D.   On: 01/30/2020 19:59    Procedures Procedures (including critical care time)  Medications Ordered in ED Medications  sodium chloride 0.9 % bolus 1,000 mL (0 mLs Intravenous Stopped 01/30/20 2140)  promethazine (PHENERGAN) injection 25 mg (25 mg Intravenous Given 01/30/20 1925)  diphenhydrAMINE (BENADRYL) injection 25 mg (25 mg Intravenous Given 01/30/20 1925)  ketorolac (TORADOL) 30 MG/ML injection 15 mg (15 mg Intravenous Given 01/30/20 2030)  dexamethasone (DECADRON) injection 4 mg (4 mg Intravenous Given 01/30/20 2031)    ED Course  I have reviewed  the triage vital signs and the nursing notes.  Pertinent labs & imaging results that were available during my care of the patient were reviewed by me and considered in my medical decision making (see chart for details).  Clinical Course as of Jan 29 2253  Mon Jan 30, 2020  2010 EKG shows HR 79, RR 760, PR 125, QRSD 144, QT 386, QTC 443, Sinus rhythm, RAE, LPFB, RBBB.   ED EKG [EH]  2142 Patient reevaluated, we discussed his elevated blood pressures, abnormal EKG.  His headache has improved from a 10 out of 10 down to a 5 out of 10 and he states he is ready to go home.   [EH]    Clinical Course User Index [EH] Norman Clay   MDM Rules/Calculators/A&P                         Patient is a 34 year old man who presents today for evaluation of headache. He is originally treated for his headache at urgent care 2 days ago, however only had temporary relief after IM Toradol. He does not have a history of similar migraines. Here he is afebrile, not tachycardic or tachypneic. He denies any chiropractic adjustments and appears neurovascularly  intact on my exam. Given that his symptoms returned after improvement with treatment at urgent care and he does not have a history of similar headaches in the discussed evaluation options, shared decision making CT scan of the head was obtained without mass, or other clear cause for his headache. EKG was obtained to evaluate for QTC prior to giving QTC prolonging medications. EKG does show concern for a left posterior fascicular block and a right bundle branch block. Patient has no prior cardiac history no reported surgeries for comparison however is not having any specific chest pain. Doubt ACS, will refer to outpatient cardiology for evaluation.  CBC and CMP are unremarkable. Covid testing is negative.  Patient was treated in the emergency room with a liter of IV fluids, IV Phenergan, Benadryl, Toradol and steroids, after this his headache significantly improved and he wished for discharge home.  He does not appear to be meningitic at this time, doubt bacterial meningitis given his course, that he has had headache for 5 days.  Return precautions were discussed with patient who states their understanding.  At the time of discharge patient denied any unaddressed complaints or concerns.  Patient is agreeable for discharge home.  Note: Portions of this report may have been transcribed using voice recognition software. Every effort was made to ensure accuracy; however, inadvertent computerized transcription errors may be present  Final Clinical Impression(s) / ED Diagnoses Final diagnoses:  Bad headache  Abnormal EKG    Rx / DC Orders ED Discharge Orders    None       Norman Clay 01/30/20 2254    Linwood Dibbles, MD 02/01/20 [phone]

## 2020-02-08 ENCOUNTER — Ambulatory Visit: Payer: Self-pay | Admitting: Cardiology

## 2020-02-08 NOTE — ED Notes (Signed)
Opened chart to reprint work not at pts request.

## 2020-02-16 ENCOUNTER — Ambulatory Visit: Payer: Self-pay | Admitting: Cardiology

## 2020-03-15 ENCOUNTER — Ambulatory Visit: Payer: Self-pay | Attending: Internal Medicine | Admitting: Internal Medicine

## 2020-03-15 ENCOUNTER — Other Ambulatory Visit: Payer: Self-pay

## 2020-04-11 ENCOUNTER — Ambulatory Visit: Payer: Self-pay | Admitting: Cardiology

## 2020-04-18 ENCOUNTER — Encounter: Payer: Self-pay | Admitting: General Practice

## 2020-06-14 ENCOUNTER — Encounter (HOSPITAL_COMMUNITY): Payer: Self-pay | Admitting: *Deleted

## 2020-06-14 ENCOUNTER — Ambulatory Visit (INDEPENDENT_AMBULATORY_CARE_PROVIDER_SITE_OTHER): Payer: Self-pay

## 2020-06-14 ENCOUNTER — Ambulatory Visit (HOSPITAL_COMMUNITY)
Admission: EM | Admit: 2020-06-14 | Discharge: 2020-06-14 | Disposition: A | Discharge status: Home / Self Care | Payer: Self-pay | Attending: Internal Medicine | Admitting: Internal Medicine

## 2020-06-14 ENCOUNTER — Other Ambulatory Visit: Payer: Self-pay

## 2020-06-14 DIAGNOSIS — W230XXA Caught, crushed, jammed, or pinched between moving objects, initial encounter: Secondary | ICD-10-CM

## 2020-06-14 DIAGNOSIS — S63613A Unspecified sprain of left middle finger, initial encounter: Secondary | ICD-10-CM

## 2020-06-14 DIAGNOSIS — S6992XA Unspecified injury of left wrist, hand and finger(s), initial encounter: Secondary | ICD-10-CM

## 2020-06-14 MED ORDER — NAPROXEN 500 MG PO TABS: 500.0000 mg | ORAL_TABLET | Freq: Two times a day (BID) | ORAL | 0 refills | Status: DC

## 2020-06-14 NOTE — ED Triage Notes (Signed)
Lt middle finger injury today.

## 2020-06-14 NOTE — ED Provider Notes (Signed)
[facility]    CSN: [id number] Arrival date & time: 06/14/20  1759      History   Chief Complaint Chief Complaint  Patient presents with  . Finger Injury    HPI Bobie Kistler is a 35 y.o. male.   Patient is here for evaluation of left middle finger injury.  Reports playing earlier and felt finger get jammed.  Denies any pain at this time.  Patient with full range of motion to finger but does note some swelling to left middle finger.  Has not taken any OTC medications or treatments. Denies any specific alleviating or aggravating factors.  Denies any fevers, chest pain, shortness of breath, N/V/D, numbness, tingling, weakness, abdominal pain, or headaches.   ROS: As per HPI, all other pertinent ROS negative   The history is provided by the patient.    History reviewed. No pertinent past medical history.  There are no problems to display for this patient.   History reviewed. No pertinent surgical history.     Home Medications    Prior to Admission medications   Medication Sig Start Date End Date Taking? Authorizing Provider  naproxen (NAPROSYN) 500 MG tablet Take 1 tablet (500 mg total) by mouth 2 (two) times daily. 06/14/20  Yes Ivette Loyal, NP  Acetaminophen-Caffeine (EXCEDRIN TENSION HEADACHE) 500-65 MG TABS Take 2 tablets by mouth daily as needed (headache).    [provider]  albuterol (PROVENTIL HFA;VENTOLIN HFA) 108 (90 Base) MCG/ACT inhaler Inhale 2 puffs into the lungs every 4 (four) hours as needed for wheezing or shortness of breath. Patient not taking: Reported on 01/30/2020 09/01/16   Hayden Rasmussen, NP  cyclobenzaprine (FLEXERIL) 10 MG tablet Take 1 tablet (10 mg total) by mouth 2 (two) times daily as needed for muscle spasms. Patient not taking: Reported on 01/30/2020 05/03/18   Dahlia Byes A, NP  ibuprofen (ADVIL) 800 MG tablet Take 800 mg by mouth every 8 (eight) hours as needed for headache.    [provider]    Family  History Family History  Problem Relation Age of Onset  . Healthy Mother   . Healthy Father     Social History Social History   Tobacco Use  . Smoking status: Light Tobacco Smoker    Types: Cigarettes  . Smokeless tobacco: Never Used  Substance Use Topics  . Alcohol use: Yes    Comment: occassional  . Drug use: No     Allergies   Patient has no known allergies.   Review of Systems Review of Systems  Musculoskeletal: Positive for joint swelling.  All other systems reviewed and are negative.    Physical Exam Triage Vital Signs ED Triage Vitals [06/14/20 1820]  Enc Vitals Group     BP 122/76     Pulse Rate 67     Resp 16     Temp 98.3 F (36.8 C)     Temp Source Oral     SpO2 97 %     Weight      Height      Head Circumference      Peak Flow      Pain Score 8     Pain Loc      Pain Edu?      Excl. in GC?    No data found.  Updated Vital Signs BP 122/76 (BP Location: Right Arm)   Pulse 67   Temp 98.3 F (36.8 C) (Oral)   Resp 16   SpO2  97%   Visual Acuity Right Eye Distance:   Left Eye Distance:   Bilateral Distance:    Right Eye Near:   Left Eye Near:    Bilateral Near:     Physical Exam Vitals and nursing note reviewed.  Constitutional:      General: He is not in acute distress.    Appearance: Normal appearance. He is not ill-appearing, toxic-appearing or diaphoretic.  HENT:     Head: Normocephalic and atraumatic.  Eyes:     Conjunctiva/sclera: Conjunctivae normal.  Cardiovascular:     Rate and Rhythm: Normal rate.     Pulses: Normal pulses.  Pulmonary:     Effort: Pulmonary effort is normal.  Abdominal:     General: Abdomen is flat.  Musculoskeletal:     Right hand: Normal.     Left hand: Swelling (edema and tenderness between PIP joint and DIP ) present. No deformity. Normal range of motion. Normal capillary refill. Normal pulse.     Cervical back: Normal range of motion.  Skin:    General: Skin is warm and dry.   Neurological:     General: No focal deficit present.     Mental Status: He is alert and oriented to person, place, and time.  Psychiatric:        Mood and Affect: Mood normal.      UC Treatments / Results  Labs (all labs ordered are listed, but only abnormal results are displayed) Labs Reviewed - No data to display  EKG   Radiology DG Finger Middle Left  Result Date: 06/14/2020 CLINICAL DATA:  Jammed finger EXAM: LEFT MIDDLE FINGER 2+V COMPARISON:  None. FINDINGS: There is no evidence of fracture or dislocation. There is no evidence of arthropathy or other focal bone abnormality. Soft tissues are unremarkable. IMPRESSION: Negative. Electronically Signed   By: Charlett Nose M.D.   On: 06/14/2020 18:39    Procedures Procedures (including critical care time)  Medications Ordered in UC Medications - No data to display  Initial Impression / Assessment and Plan / UC Course  I have reviewed the triage vital signs and the nursing notes.  Pertinent labs & imaging results that were available during my care of the patient were reviewed by me and considered in my medical decision making (see chart for details).     Left middle finger sprain.  Assessment negative for red flags or concerns.  X-ray with no evidence of fracture or dislocation.  Naproxen twice daily as needed for pain.  Recommend rest, ice, and can use bandage or splint.  Recommend gentle stretching and range of motion's to prevent finger stiffness.  Follow-up as needed.  Final Clinical Impressions(s) / UC Diagnoses   Final diagnoses:  Sprain of left middle finger, unspecified site of digit, initial encounter  Injury of finger of left hand, initial encounter     Discharge Instructions     Take the Naproxen as needed for pain.   Rest, Ice for 10-15 minutes every 4-6 hours as needed for pain and swelling, and apply Compression (ace bandage or splint) for the next few days.    Gentle stretching and range of motion to  prevent your finger from getting stiff.   Return or go to the Emergency Department if symptoms worsen or do not improve in the next few days.      ED Prescriptions    Medication Sig Dispense Auth. Provider   naproxen (NAPROSYN) 500 MG tablet Take 1 tablet (500 mg total) by mouth  2 (two) times daily. 30 tablet Ivette Loyal, NP     PDMP not reviewed this encounter.   Ivette Loyal, NP 06/14/20 1905

## 2020-06-14 NOTE — Discharge Instructions (Addendum)
Take the Naproxen as needed for pain.   Rest, Ice for 10-15 minutes every 4-6 hours as needed for pain and swelling, and apply Compression (ace bandage or splint) for the next few days.    Gentle stretching and range of motion to prevent your finger from getting stiff.   Return or go to the Emergency Department if symptoms worsen or do not improve in the next few days.

## 2021-01-14 IMAGING — CT CT HEAD W/O CM
3 series · 16 of 47 positions shown, 19 images · non-contrast
Comparison: None.

CLINICAL DATA: Headaches.

EXAM:
CT HEAD WITHOUT CONTRAST
TECHNIQUE: Contiguous axial images were obtained from the base of the skull
through the vertex without intravenous contrast.

[Series 2: head wo · axial · 0.42mm/px · z∈[+494,+619]mm · 10 of 30 slices shown, 13 images]
[im 3/30  brain]
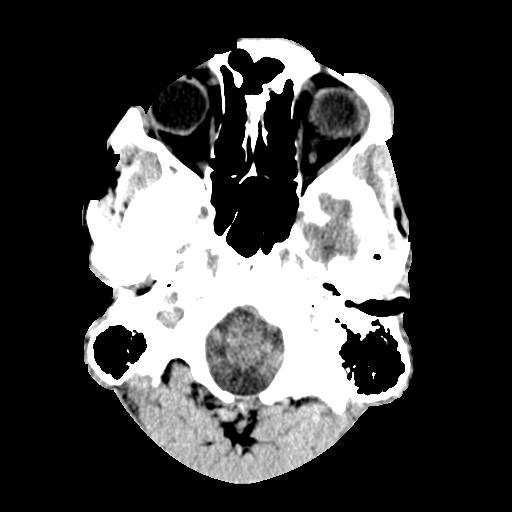
[im 3/30  bone]
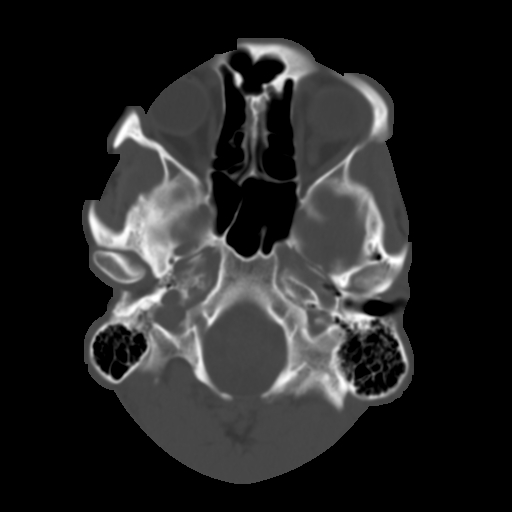
[im 6/30  brain]
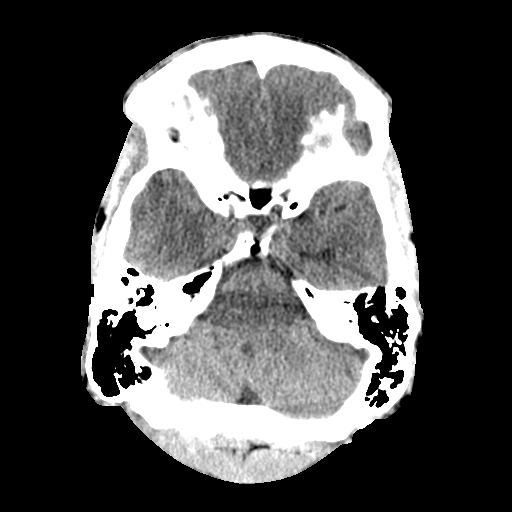
[im 9/30  brain]
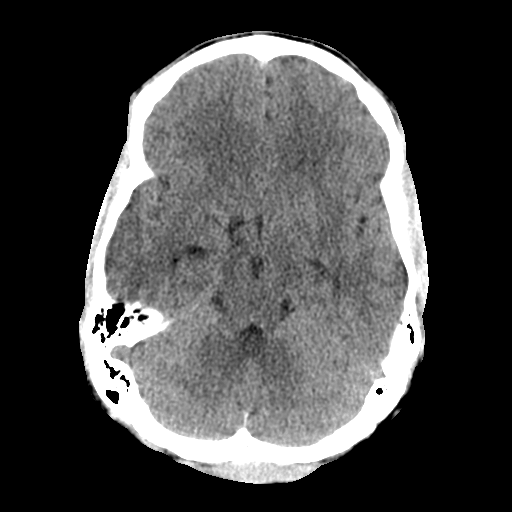
[im 11/30  brain]
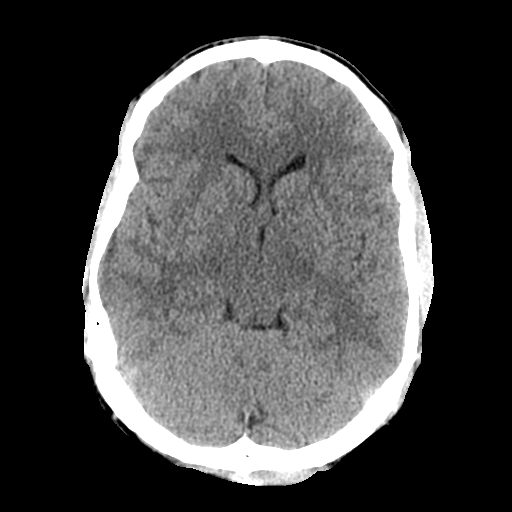
[im 14/30  brain]
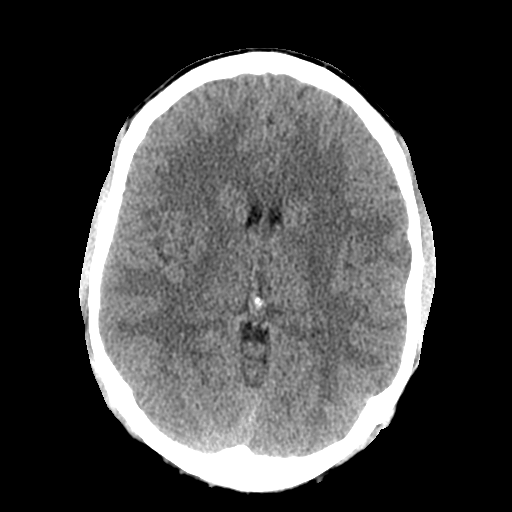
[im 14/30  bone]
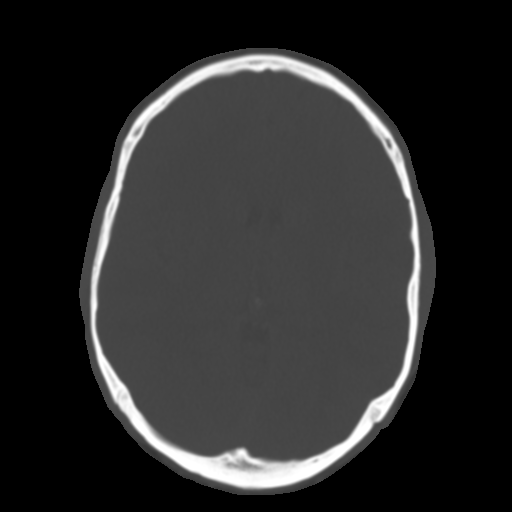
[im 17/30  brain]
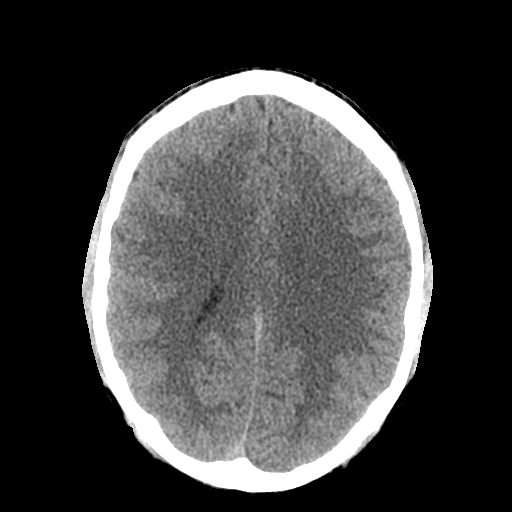
[im 20/30  brain]
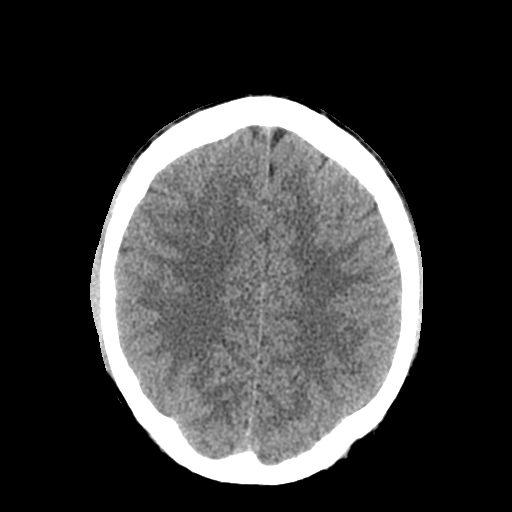
[im 23/30  brain]
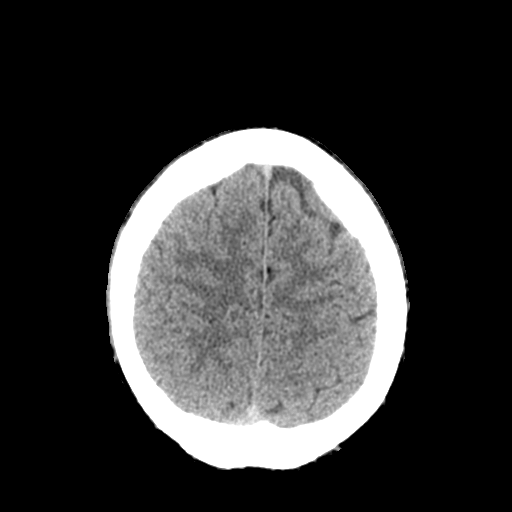
[im 25/30  brain]
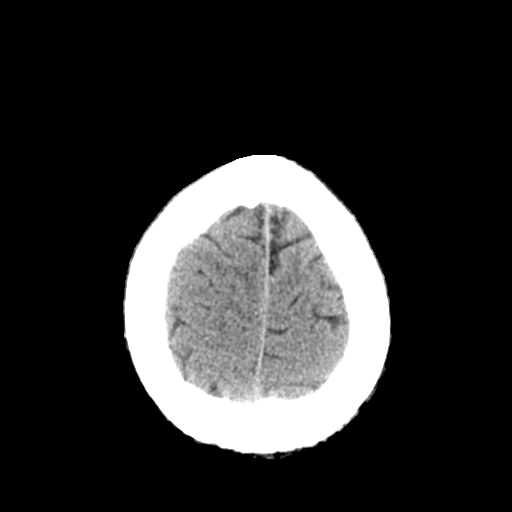
[im 25/30  bone]
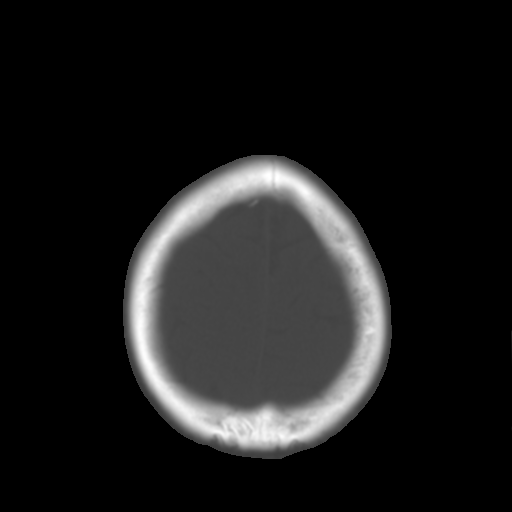
[im 28/30  brain]
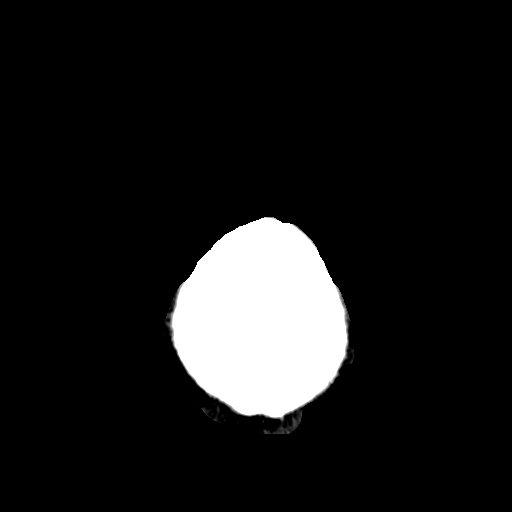

[Series 5: coronal soft tissue · coronal · 0.30mm/px · 3 of 67 slices shown]
[im 23/67  brain]
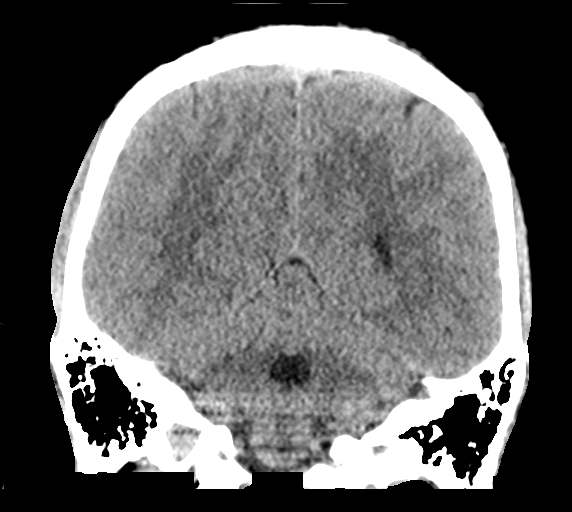
[im 30/67  brain]
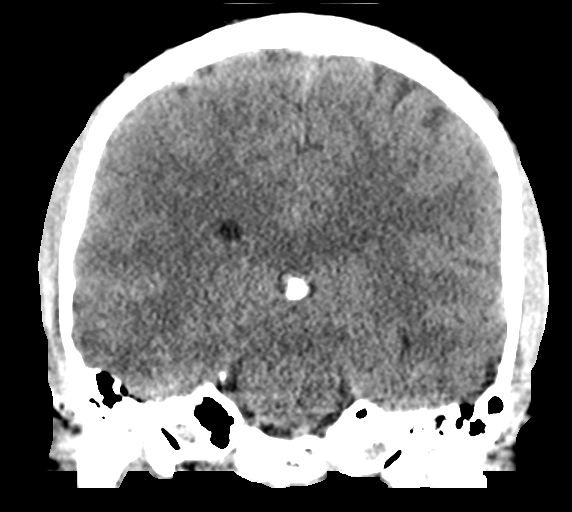
[im 37/67  brain]
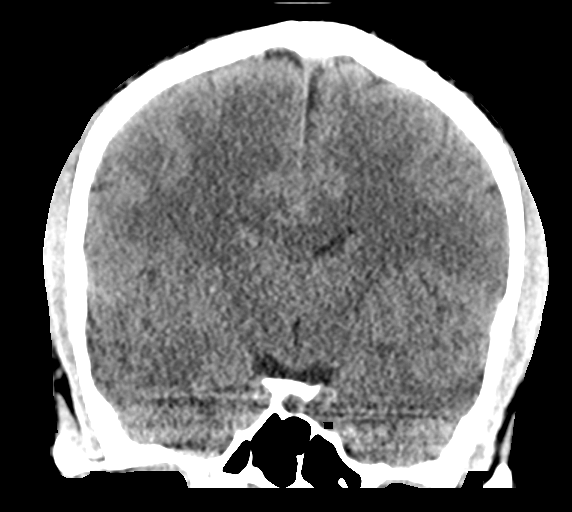

[Series 6: sagittal soft tissue · sagittal · 0.30mm/px · 3 of 52 slices shown]
[im 18/52  brain]
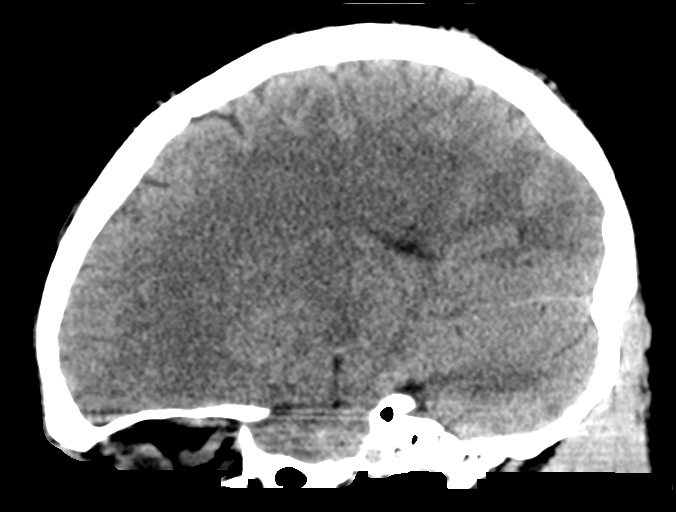
[im 26/52  brain]
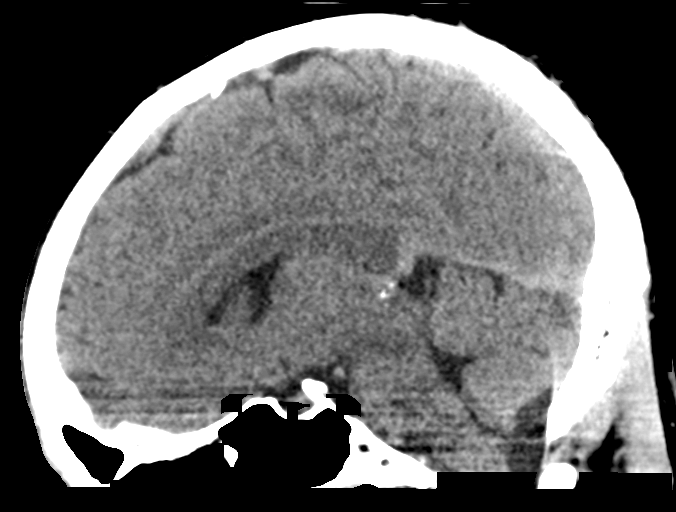
[im 35/52  brain]
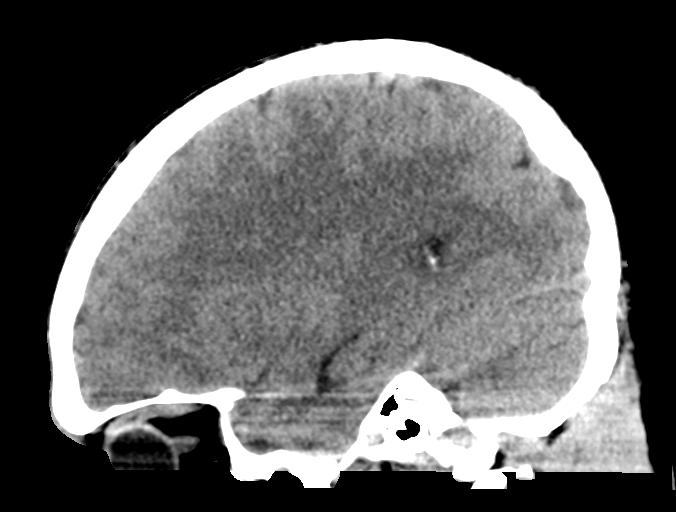

[16 of 47 positions shown; findings below may reference images not displayed]

FINDINGS: Brain: No evidence of acute infarction, hemorrhage, hydrocephalus,
extra-axial collection or mass lesion/mass effect.

Vascular: No hyperdense vessel or unexpected calcification.

Skull: Normal. Negative for fracture or focal lesion.

Sinuses/Orbits: No acute finding.

Other: None.
IMPRESSION: No acute intracranial pathology.

## 2022-04-09 ENCOUNTER — Emergency Department (HOSPITAL_COMMUNITY)
Admission: EM | Admit: 2022-04-09 | Discharge: 2022-04-10 | Disposition: A | Discharge status: Home / Self Care | Payer: 59 | Attending: Emergency Medicine | Admitting: Emergency Medicine

## 2022-04-09 ENCOUNTER — Emergency Department (HOSPITAL_COMMUNITY): Payer: 59

## 2022-04-09 ENCOUNTER — Other Ambulatory Visit: Payer: Self-pay

## 2022-04-09 DIAGNOSIS — Y9367 Activity, basketball: Secondary | ICD-10-CM | POA: Diagnosis not present

## 2022-04-09 DIAGNOSIS — S8392XA Sprain of unspecified site of left knee, initial encounter: Secondary | ICD-10-CM

## 2022-04-09 DIAGNOSIS — S8992XA Unspecified injury of left lower leg, initial encounter: Secondary | ICD-10-CM | POA: Diagnosis present

## 2022-04-09 DIAGNOSIS — X509XXA Other and unspecified overexertion or strenuous movements or postures, initial encounter: Secondary | ICD-10-CM | POA: Insufficient documentation

## 2022-04-09 DIAGNOSIS — S83402A Sprain of unspecified collateral ligament of left knee, initial encounter: Secondary | ICD-10-CM | POA: Insufficient documentation

## 2022-04-09 NOTE — ED Triage Notes (Signed)
Pt reports playing ball today and reports left knee feels like it buckled. Pt reports significant pain with trying to bed knee. Pt ambulatory to triage.

## 2022-04-09 NOTE — ED Provider Triage Note (Signed)
Emergency Medicine Provider Triage Evaluation Note  Scott Black , a 37 y.o. male  was evaluated in triage.  Pt complains of left knee pain.  Patient states he was playing ball today and reports he felt his left knee buckle.  He is now reporting significant pain with trying to bend or move his knee  Review of Systems  Positive: As above Negative: As above  Physical Exam  BP 133/86   Pulse (!) 105   Temp 98.4 F (36.9 C) (Oral)   Resp 20   SpO2 100%  Gen:   Awake, no distress   Resp:  Normal effort  MSK:   Moves extremities without difficulty  Other:  No obvious gross deformity to the L knee, patient has limited ROM due to pain  Medical Decision Making  Medically screening exam initiated at 11:40 PM.  Appropriate orders placed.  Scott Black was informed that the remainder of the evaluation will be completed by another provider, this initial triage assessment does not replace that evaluation, and the importance of remaining in the ED until their evaluation is complete.     Theressa Stamps R, [place] 04/09/22 [phone]

## 2022-04-10 MED ORDER — CYCLOBENZAPRINE HCL 10 MG PO TABS
5.0000 mg | ORAL_TABLET | Freq: Once | ORAL | Status: AC
  Administered 2022-04-10: 5 mg via ORAL
  Filled 2022-04-10: qty 1

## 2022-04-10 MED ORDER — NAPROXEN 500 MG PO TABS: 500.0000 mg | ORAL_TABLET | Freq: Two times a day (BID) | ORAL | 0 refills | Status: DC

## 2022-04-10 MED ORDER — KETOROLAC TROMETHAMINE 30 MG/ML IJ SOLN
30.0000 mg | Freq: Once | INTRAMUSCULAR | Status: AC
  Administered 2022-04-10: 30 mg via INTRAMUSCULAR
  Filled 2022-04-10: qty 1

## 2022-04-10 NOTE — Discharge Instructions (Signed)
You were seen today for knee pain.  Your x-rays do not show any fracture.  You likely have a sprain and/or meniscal injury.  Follow-up with orthopedics.  Ice and elevate the knee.  Use knee brace when walking for support and comfort.

## 2022-04-10 NOTE — ED Provider Notes (Signed)
[place] EMERGENCY DEPARTMENT AT [facility] Provider Note   CSN: [id number] Arrival date & time: 04/09/22  2320     History  Chief Complaint  Patient presents with   Knee Injury    Candido Flott is a 37 y.o. male.  HPI     This is a 37 year old male who presents with left knee pain.  Patient reports he had a remote injury of the left knee several years ago when he overextended that.  However, he has not had any significant pain.  He was playing basketball with his son when he planted his foot and felt his left knee buckle.  He reports significant pain.  He reports pain with attempting to bend his knee.  No numbness or tingling in the leg.  He has been able to bear weight.  Home Medications Prior to Admission medications   Medication Sig Start Date End Date Taking? Authorizing Provider  naproxen (NAPROSYN) 500 MG tablet Take 1 tablet (500 mg total) by mouth 2 (two) times daily. 04/10/22  Yes Zionna Homewood, Barbette Hair, MD  Acetaminophen-Caffeine [facility] TENSION HEADACHE) 500-65 MG TABS Take 2 tablets by mouth daily as needed (headache).    [provider]  albuterol (PROVENTIL HFA;VENTOLIN HFA) 108 (90 Base) MCG/ACT inhaler Inhale 2 puffs into the lungs every 4 (four) hours as needed for wheezing or shortness of breath. Patient not taking: Reported on 01/30/2020 09/01/16   Janne Napoleon, NP  cyclobenzaprine (FLEXERIL) 10 MG tablet Take 1 tablet (10 mg total) by mouth 2 (two) times daily as needed for muscle spasms. Patient not taking: Reported on 01/30/2020 05/03/18   Loura Halt A, NP  ibuprofen (ADVIL) 800 MG tablet Take 800 mg by mouth every 8 (eight) hours as needed for headache.    [provider]      Allergies    Patient has no known allergies.    Review of Systems   Review of Systems  Musculoskeletal:        Knee pain  All other systems reviewed and are negative.   Physical Exam Updated Vital Signs BP 133/86   Pulse (!) 105   Temp 98.4 F  (36.9 C) (Oral)   Resp 20   SpO2 100%  Physical Exam Vitals and nursing note reviewed.  Constitutional:      Appearance: He is well-developed. He is not ill-appearing.  HENT:     Head: Normocephalic and atraumatic.  Eyes:     Pupils: Pupils are equal, round, and reactive to light.  Cardiovascular:     Rate and Rhythm: Normal rate and regular rhythm.  Pulmonary:     Effort: Pulmonary effort is normal. No respiratory distress.  Abdominal:     Palpations: Abdomen is soft.     Tenderness: There is no abdominal tenderness.  Musculoskeletal:     Cervical back: Neck supple.     Comments: Focused examination of the left knee reveals no significant deformities, slight effusion, significant pain with passive and active range of motion, patient has significant pain with straight leg raise but does appear to be able to fire his quad, no defect noted of the quad tendon, he has joint line tenderness, old scarring noted laterally, neurovascular intact distally  Lymphadenopathy:     Cervical: No cervical adenopathy.  Skin:    General: Skin is warm and dry.  Neurological:     Mental Status: He is alert and oriented to person, place, and time.  Psychiatric:  Mood and Affect: Mood normal.     ED Results / Procedures / Treatments   Labs (all labs ordered are listed, but only abnormal results are displayed) Labs Reviewed - No data to display  EKG None  Radiology DG Knee Complete 4 Views Left  Result Date: 04/10/2022 CLINICAL DATA:  Basketball injury with left knee pain, initial encounter EXAM: LEFT KNEE - COMPLETE 4+ VIEW COMPARISON:  None Available. FINDINGS: No evidence of fracture, dislocation, or joint effusion. No evidence of arthropathy or other focal bone abnormality. Soft tissues are unremarkable. IMPRESSION: No acute abnormality noted. Electronically Signed   By: Inez Catalina M.D.   On: 04/10/2022 00:07    Procedures Procedures    Medications Ordered in ED Medications   ketorolac (TORADOL) 30 MG/ML injection 30 mg (30 mg Intramuscular Given 04/10/22 0103)  cyclobenzaprine (FLEXERIL) tablet 5 mg (5 mg Oral Given 04/10/22 0104)    ED Course/ Medical Decision Making/ A&P                             Medical Decision Making Risk Prescription drug management.   This patient presents to the ED for concern of knee pain, this involves an extensive number of treatment options, and is a complaint that carries with it a high risk of complications and morbidity.  I considered the following differential and admission for this acute, potentially life threatening condition.  The differential diagnosis includes fracture, sprain, dislocation, meniscal injury  MDM:    This is a 37 year old male who presents with left knee pain.  He is overall nontoxic and vital signs are reassuring.  He has pain with range of motion.  His exam is somewhat limited secondary to pain.  There is no obvious deformity.  He does appear to be able to fire his quadriceps tendon and have low suspicion for quadriceps tear or patellar injury.  High suspicion for ligamentous or meniscal injury.  X-rays are negative for acute fracture.  Patient was placed in a knee immobilizer.  Recommend ice and elevation.  Will give anti-inflammatories and orthopedic follow-up.  (Labs, imaging, consults)  Labs: I Ordered, and personally interpreted labs.  The pertinent results include: None  Imaging Studies ordered: I ordered imaging studies including x-ray knee  I independently visualized and interpreted imaging. I agree with the radiologist interpretation  Additional history obtained from chart review.  External records from outside source obtained and reviewed including prior evaluations  Cardiac Monitoring:  Sinus rhythm  Reevaluation: After the interventions noted above, I reevaluated the patient and found that they have :improved  Social Determinants of Health:  lives independently  Disposition:  Discharge  Co morbidities that complicate the patient evaluation No past medical history on file.   Medicines Meds ordered this encounter  Medications   ketorolac (TORADOL) 30 MG/ML injection 30 mg   cyclobenzaprine (FLEXERIL) tablet 5 mg   naproxen (NAPROSYN) 500 MG tablet    Sig: Take 1 tablet (500 mg total) by mouth 2 (two) times daily.    Dispense:  30 tablet    Refill:  0    I have reviewed the patients home medicines and have made adjustments as needed  Problem List / ED Course: Problem List Items Addressed This Visit   None Visit Diagnoses     Sprain of left knee, unspecified ligament, initial encounter    -  Primary  Final Clinical Impression(s) / ED Diagnoses Final diagnoses:  Sprain of left knee, unspecified ligament, initial encounter    Rx / DC Orders ED Discharge Orders          Ordered    naproxen (NAPROSYN) 500 MG tablet  2 times daily        04/10/22 0133              Amanda Pote, Barbette Hair, MD 04/10/22 [phone]

## 2022-11-17 ENCOUNTER — Encounter (HOSPITAL_COMMUNITY): Payer: Self-pay

## 2022-11-17 ENCOUNTER — Ambulatory Visit (HOSPITAL_COMMUNITY)
Admission: EM | Admit: 2022-11-17 | Discharge: 2022-11-17 | Disposition: A | Discharge status: Home / Self Care | Payer: 59 | Attending: Emergency Medicine | Admitting: Emergency Medicine

## 2022-11-17 DIAGNOSIS — J988 Other specified respiratory disorders: Secondary | ICD-10-CM | POA: Insufficient documentation

## 2022-11-17 DIAGNOSIS — Z1152 Encounter for screening for COVID-19: Secondary | ICD-10-CM | POA: Insufficient documentation

## 2022-11-17 DIAGNOSIS — B9789 Other viral agents as the cause of diseases classified elsewhere: Secondary | ICD-10-CM | POA: Insufficient documentation

## 2022-11-17 NOTE — Discharge Instructions (Addendum)
Your COVID results will come back within 24 hours. You can alternate between Tylenol and ibuprofen for pain and fever as needed. And take over-the-counter cough medication as needed. You can also use Flonase nasal spray and take a daily Zyrtec for congestion.  Otherwise get plenty of rest and hydration over the next few days.

## 2022-11-17 NOTE — ED Provider Notes (Signed)
[facility]    CSN: [id number] Arrival date & time: 11/17/22  4098      History   Chief Complaint Chief Complaint  Patient presents with   Fever    HPI Alias Grahn is a 37 y.o. male.   Patient presents with fever, body aches, sore throat, fatigue, headache, congestion, and loss of taste x 2 days.  Patient states he has been taking Tylenol with relief of fever and headache.  Patient states that coworkers have recently tested positive for COVID.  Patient denies cough, shortness of breath, abdominal pain, nausea, vomiting, and diarrhea.   Fever Associated symptoms: chills, congestion, rhinorrhea and sore throat   Associated symptoms: no chest pain, no cough, no diarrhea, no ear pain, no nausea and no vomiting     History reviewed. No pertinent past medical history.  There are no problems to display for this patient.   History reviewed. No pertinent surgical history.     Home Medications    Prior to Admission medications   Not on File    Family History Family History  Problem Relation Age of Onset   Healthy Mother    Healthy Father     Social History Social History   Tobacco Use   Smoking status: Former    Types: Cigarettes   Smokeless tobacco: Never  Vaping Use   Vaping status: Never Used  Substance Use Topics   Alcohol use: Yes    Comment: occassional   Drug use: No     Allergies   Patient has no known allergies.   Review of Systems Review of Systems  Constitutional:  Positive for activity change, chills, fatigue and fever.  HENT:  Positive for congestion, rhinorrhea, sinus pain and sore throat. Negative for ear pain, sinus pressure and trouble swallowing.   Respiratory:  Negative for cough, chest tightness, shortness of breath and wheezing.   Cardiovascular:  Negative for chest pain.  Gastrointestinal:  Negative for abdominal pain, diarrhea, nausea and vomiting.  Skin:  Negative for color change.  Neurological:  Negative for  dizziness, weakness and light-headedness.     Physical Exam Triage Vital Signs ED Triage Vitals  Encounter Vitals Group     BP 11/17/22 2011 121/83     Systolic BP Percentile --      Diastolic BP Percentile --      Pulse Rate 11/17/22 2011 69     Resp 11/17/22 2011 16     Temp 11/17/22 2011 98.2 F (36.8 C)     Temp Source 11/17/22 2011 Oral     SpO2 11/17/22 2011 98 %     Weight 11/17/22 2009 170 lb (77.1 kg)     Height 11/17/22 2009 6' (1.829 m)     Head Circumference --      Peak Flow --      Pain Score 11/17/22 2009 0     Pain Loc --      Pain Education --      Exclude from Growth Chart --    No data found.  Updated Vital Signs BP 121/83 (BP Location: Left Arm)   Pulse 69   Temp 98.2 F (36.8 C) (Oral)   Resp 16   Ht 6' (1.829 m)   Wt 170 lb (77.1 kg)   SpO2 98%   BMI 23.06 kg/m   Visual Acuity Right Eye Distance:   Left Eye Distance:   Bilateral Distance:    Right Eye Near:   Left Eye Near:  Bilateral Near:     Physical Exam Vitals and nursing note reviewed.  Constitutional:      General: He is awake. He is not in acute distress.    Appearance: Normal appearance. He is well-developed and well-groomed. He is not ill-appearing, toxic-appearing or diaphoretic.  HENT:     Right Ear: Tympanic membrane, ear canal and external ear normal.     Left Ear: Tympanic membrane, ear canal and external ear normal.     Nose: Congestion and rhinorrhea present.     Mouth/Throat:     Mouth: Mucous membranes are moist.     Pharynx: Posterior oropharyngeal erythema and postnasal drip present. No pharyngeal swelling or oropharyngeal exudate.     Tonsils: No tonsillar exudate.  Cardiovascular:     Rate and Rhythm: Normal rate.     Heart sounds: Normal heart sounds.  Pulmonary:     Effort: Pulmonary effort is normal. No respiratory distress.     Breath sounds: Normal breath sounds. No wheezing.  Musculoskeletal:     Cervical back: Normal range of motion.  Skin:     General: Skin is warm and dry.  Neurological:     Mental Status: He is alert.  Psychiatric:        Behavior: Behavior is cooperative.      UC Treatments / Results  Labs (all labs ordered are listed, but only abnormal results are displayed) Labs Reviewed  SARS CORONAVIRUS 2 (TAT 6-24 HRS)    EKG   Radiology No results found.  Procedures Procedures (including critical care time)  Medications Ordered in UC Medications - No data to display  Initial Impression / Assessment and Plan / UC Course  I have reviewed the triage vital signs and the nursing notes.  Pertinent labs & imaging results that were available during my care of the patient were reviewed by me and considered in my medical decision making (see chart for details).     Patient presents with 2-day history of fever, body aches, sore throat, fatigue, headache, congestion, and loss of taste.  Patient states he has been taking Tylenol with relief of fever and headache.  Reports that coworkers have recently tested positive for COVID.  Patient denies cough, shortness of breath, abdominal pain, nausea, vomiting, and diarrhea.  Upon assessment patient has mild erythema to throat.  Lungs are clear on auscultation bilaterally.  COVID testing ordered.  Discussed over-the-counter medication for symptoms.  Discussed follow-up and return precautions. Final Clinical Impressions(s) / UC Diagnoses   Final diagnoses:  Viral respiratory illness     Discharge Instructions      Your COVID results will come back within 24 hours. You can alternate between Tylenol and ibuprofen for pain and fever as needed. And take over-the-counter cough medication as needed. You can also use Flonase nasal spray and take a daily Zyrtec for congestion.  Otherwise get plenty of rest and hydration over the next few days.    ED Prescriptions   None    PDMP not reviewed this encounter.   Wynonia Lawman A, NP 11/17/22 2036

## 2022-11-17 NOTE — ED Triage Notes (Signed)
Patient here today with c/o fever, loss of taste, body aches, sore throat, fatigue, and headache X 2 days. Patient has been taking Tylenol to help reduce the fever. Coworkers have also been sick. No recent travel.

## 2022-11-18 LAB — SARS CORONAVIRUS 2 (TAT 6-24 HRS): SARS Coronavirus 2: NEGATIVE
